# Patient Record
Sex: Female | Born: 1940
Health system: Southern US, Community
[De-identification: ages and names within clinical notes are randomized; demographics above are authoritative.]

## PROBLEM LIST (undated history)

## (undated) DIAGNOSIS — I10 Essential (primary) hypertension: Secondary | ICD-10-CM

## (undated) DIAGNOSIS — G8929 Other chronic pain: Secondary | ICD-10-CM

## (undated) DIAGNOSIS — M549 Dorsalgia, unspecified: Secondary | ICD-10-CM

## (undated) DIAGNOSIS — K209 Esophagitis, unspecified without bleeding: Secondary | ICD-10-CM

## (undated) DIAGNOSIS — H269 Unspecified cataract: Secondary | ICD-10-CM

## (undated) DIAGNOSIS — M79606 Pain in leg, unspecified: Secondary | ICD-10-CM

## (undated) DIAGNOSIS — K219 Gastro-esophageal reflux disease without esophagitis: Secondary | ICD-10-CM

## (undated) DIAGNOSIS — E785 Hyperlipidemia, unspecified: Secondary | ICD-10-CM

## (undated) DIAGNOSIS — M1990 Unspecified osteoarthritis, unspecified site: Secondary | ICD-10-CM

## (undated) DIAGNOSIS — T7840XA Allergy, unspecified, initial encounter: Secondary | ICD-10-CM

## (undated) DIAGNOSIS — K5790 Diverticulosis of intestine, part unspecified, without perforation or abscess without bleeding: Secondary | ICD-10-CM

## (undated) HISTORY — PX: CATARACT EXTRACTION: SUR2

## (undated) HISTORY — DX: Esophagitis, unspecified without bleeding: K20.90

## (undated) HISTORY — DX: Hyperlipidemia, unspecified: E78.5

## (undated) HISTORY — DX: Diverticulosis of intestine, part unspecified, without perforation or abscess without bleeding: K57.90

## (undated) HISTORY — DX: Unspecified cataract: H26.9

## (undated) HISTORY — DX: Essential (primary) hypertension: I10

## (undated) HISTORY — DX: Dorsalgia, unspecified: M54.9

## (undated) HISTORY — DX: Other chronic pain: G89.29

## (undated) HISTORY — DX: Gastro-esophageal reflux disease without esophagitis: K21.9

## (undated) HISTORY — PX: BREAST RECONSTRUCTION: SHX9

## (undated) HISTORY — PX: CHOLECYSTECTOMY: SHX55

## (undated) HISTORY — DX: Allergy, unspecified, initial encounter: T78.40XA

## (undated) HISTORY — DX: Esophagitis, unspecified: K20.9

## (undated) HISTORY — PX: TONSILLECTOMY: SUR1361

## (undated) HISTORY — DX: Unspecified osteoarthritis, unspecified site: M19.90

## (undated) HISTORY — DX: Pain in leg, unspecified: M79.606

## (undated) HISTORY — PX: ABDOMINAL HYSTERECTOMY: SHX81

## (undated) HISTORY — PX: DILATION AND CURETTAGE OF UTERUS: SHX78

---

## 2004-08-08 ENCOUNTER — Other Ambulatory Visit: Admission: RE | Admit: 2004-08-08 | Discharge: 2004-08-08 | Payer: Self-pay | Admitting: Obstetrics and Gynecology

## 2004-10-18 ENCOUNTER — Ambulatory Visit (HOSPITAL_BASED_OUTPATIENT_CLINIC_OR_DEPARTMENT_OTHER): Admission: RE | Admit: 2004-10-18 | Discharge: 2004-10-18 | Payer: Self-pay | Admitting: Plastic Surgery

## 2004-10-18 ENCOUNTER — Encounter (INDEPENDENT_AMBULATORY_CARE_PROVIDER_SITE_OTHER): Payer: Self-pay | Admitting: Specialist

## 2004-10-18 ENCOUNTER — Ambulatory Visit (HOSPITAL_COMMUNITY): Admission: RE | Admit: 2004-10-18 | Discharge: 2004-10-18 | Payer: Self-pay | Admitting: Plastic Surgery

## 2005-09-10 ENCOUNTER — Other Ambulatory Visit: Admission: RE | Admit: 2005-09-10 | Discharge: 2005-09-10 | Payer: Self-pay | Admitting: Obstetrics and Gynecology

## 2006-05-01 ENCOUNTER — Encounter: Admission: RE | Admit: 2006-05-01 | Discharge: 2006-05-01 | Payer: Self-pay | Admitting: Internal Medicine

## 2006-10-09 ENCOUNTER — Other Ambulatory Visit: Admission: RE | Admit: 2006-10-09 | Discharge: 2006-10-09 | Payer: Self-pay | Admitting: Obstetrics and Gynecology

## 2007-02-27 ENCOUNTER — Ambulatory Visit: Payer: Self-pay | Admitting: Internal Medicine

## 2007-03-17 ENCOUNTER — Ambulatory Visit: Payer: Self-pay | Admitting: Internal Medicine

## 2007-10-12 ENCOUNTER — Other Ambulatory Visit: Admission: RE | Admit: 2007-10-12 | Discharge: 2007-10-12 | Payer: Self-pay | Admitting: Obstetrics and Gynecology

## 2009-01-10 ENCOUNTER — Encounter: Admission: RE | Admit: 2009-01-10 | Discharge: 2009-01-10 | Payer: Self-pay | Admitting: Orthopaedic Surgery

## 2011-03-15 NOTE — Op Note (Signed)
NAMEKANG, ISHIDA              ACCOUNT NO.:  [id number]   MEDICAL RECORD NO.:  [id number]          PATIENT TYPE:  AMB   LOCATION:  DSC                          FACILITY:  [facility]   PHYSICIAN:  Alfredia Ferguson, M.D.  DATE OF BIRTH:  12-Dec-1940   DATE OF PROCEDURE:  10/18/2004  DATE OF DISCHARGE:                                 OPERATIVE REPORT   PREOPERATIVE DIAGNOSIS:  A 1.5-cm biopsy proven basal cell carcinoma of the  right temple.   POSTOPERATIVE DIAGNOSIS:  A 1.5-cm biopsy proven basal cell carcinoma of the  right temple.   PROCEDURE:  Excision of basal cell carcinoma with approximately 2- to 3-mm  margins.   SURGEON:  Alfredia Ferguson, M.D.   ANESTHESIA:  Two percent Xylocaine, 1:100,000 epinephrine.   INDICATIONS FOR SURGERY:  This is a 70 year old woman with a biopsy-proven,  large basal cell carcinoma in her right temple just behind the hair line.  Patient wishes to have this area excised.  The plan is to excise it with 2-  to 3-mm margins.  The risks of positive margins and recurrence of the lesion  were discussed with the patient.  She understands that and in spite of that  potential risk, wishes to proceed.   DESCRIPTION OF PROCEDURE:  An elliptical skin mark was placed around the  lesion and local anesthesia was infiltrated.  Hair was shaved in the area of  the excision.  After waiting 10 minutes, an elliptical excision of the  lesion down to the level of the temporalis muscle was carried out.  The  specimen was submitted for pathology.  Wound edges were undermined for a  distance of 1cm in all directions.  Hemostasis was accomplished using  electrocautery.  Wound was closed using multiple interrupted 4-0 Monocryl  sutures followed by running 4-0 nylon suture for the skin edges.  The area  was cleansed and dry and patient was discharged to home in satisfactory  condition.      Tiburcio Pea  D:  10/18/2004  T:  10/18/2004  Job:  [id number]   cc:   Hope M.  Danella Deis, M.D.  [address]  [place]  [place] [address]  Fax: [phone]

## 2011-05-24 ENCOUNTER — Encounter: Payer: Self-pay | Admitting: *Deleted

## 2011-05-24 ENCOUNTER — Telehealth: Payer: Self-pay | Admitting: Internal Medicine

## 2011-05-24 NOTE — Telephone Encounter (Signed)
Spoke with Malachi Bonds and scheduled patient on 06/03/11 at 2:30 PM with Dr. Juanda Chance. Malachi Bonds will fax records on patient. Letter mailed to patient.

## 2011-05-29 ENCOUNTER — Encounter: Payer: Self-pay | Admitting: Internal Medicine

## 2011-05-31 ENCOUNTER — Encounter: Payer: Self-pay | Admitting: Internal Medicine

## 2011-06-03 ENCOUNTER — Other Ambulatory Visit (INDEPENDENT_AMBULATORY_CARE_PROVIDER_SITE_OTHER): Payer: Medicare Other

## 2011-06-03 ENCOUNTER — Ambulatory Visit (INDEPENDENT_AMBULATORY_CARE_PROVIDER_SITE_OTHER): Payer: Medicare Other | Admitting: Internal Medicine

## 2011-06-03 ENCOUNTER — Encounter: Payer: Self-pay | Admitting: Internal Medicine

## 2011-06-03 VITALS — BP 114/72 | HR 68 | Ht 62.0 in | Wt 124.0 lb

## 2011-06-03 DIAGNOSIS — R109 Unspecified abdominal pain: Secondary | ICD-10-CM

## 2011-06-03 DIAGNOSIS — R1010 Upper abdominal pain, unspecified: Secondary | ICD-10-CM

## 2011-06-03 LAB — AMYLASE: Amylase: 103 U/L (ref 27–131)

## 2011-06-03 LAB — HEPATIC FUNCTION PANEL: ALT: 16 U/L (ref 0–35)

## 2011-06-03 NOTE — Patient Instructions (Signed)
You have been scheduled for an endoscopy. Please follow written instructions given to you at your visit today. You have been scheduled for an abdominal ultrasound at [facility] (1st floor of hospital) on 06/10/11 at 8:00 am. Please arrive 15 minutes prior to your appointment for registration. Make certain not to have anything to eat or drink 6 hours prior to your appointment. Should you need to reschedule your appointment, please contact radiology at [phone]. Your physician has requested that you go to the basement for the following lab work before leaving today: Hepatic Panel, Amylase, Lipase CC: Dr Buren Kos

## 2011-06-03 NOTE — Progress Notes (Signed)
Donna Lynn 1941-07-13 MRN [id number]     History of Present Illness:  This is a 70 year old, white female with an acute episode of upper abdominal pain radiating along her upper abdomen, which occurred several weeks ago and woke her up at 2 in the morning. The pain felt better  the next day but she was bloated and distended and was unable to eat for several days. She had a remote cholecystectomy in 1990s for cholelithiasis. The surgery was uncomplicated. We have done a screening colonoscopy in May 2008 which showed mild diverticulosis. She saw Dr.Shaw 10 days ago and was started on Prilosec 20 mg twice a day. The symptoms subsided but she had another attack 4 days ago that was even worse than the first one and occurred at night. She is now about 50% improved. There has been no fever, diarrhea or rectal bleeding.Her LFT's in [facility] office were normal   Past Medical History  Diagnosis Date  . Hypertension   . Hyperlipidemia   . Chronic back pain   . Chronic leg pain   . Diverticulosis   . Arthritis    Past Surgical History  Procedure Date  . Breast reconstruction     and cyst removal-right  . Dilation and curettage of uterus   . Tonsillectomy   . Abdominal hysterectomy     partial  . Cholecystectomy     reports that she has quit smoking. She has never used smokeless tobacco. She reports that she drinks alcohol. She reports that she does not use illicit drugs. family history includes Breast cancer in her mother; Diverticulosis in her mother; and Irritable bowel syndrome in her mother.  There is no history of Colon cancer. No Known Allergies      Review of Systems: Denies chest pain or shortness of breath denies diarrhea constipation or rectal bleeding  The remainder of the 10  point ROS is negative except as outlined in H&P   Physical Exam: General appearance  Well developed, in no distress. Eyes- non icteric. HEENT nontraumatic, normocephalic. Mouth no lesions,  tongue papillated, no cheilosis. Neck supple without adenopathy, thyroid not enlarged, no carotid bruits, no JVD. Lungs Clear to auscultation bilaterally. Cor normal S1 normal S2, regular rhythm , no murmur,  quiet precordium. Abdomen soft abdomen with normal active bowel sounds. No distention. Very tender in epigastrium but not very tender in the left or right upper quadrants. Liver edge is at the costal margin and the liver itself did not appear to be tender. Lower abdomen was unremarkable. Rectal: Not done. Extremities no pedal edema. Skin no lesions. Neurological alert and oriented x 3. Psychological normal mood and affect.  Assessment and Plan:  Problem #1 Acute upper abdominal pain now occurring in 2 discrete episodes. This could be due to Gastroesophageal reflux with spasm, hiatal hernia or gastritis. This could also be biliary colic due to choledocholithiasis with low-grade pancreatitis. We will repeat her liver function test, amylase and lipase which were all normal 10 days ago. We will schedule her for an upper endoscopy and keep her on Prilosec 20 mg twice a day. We will also go ahead and schedule an upper abdominal ultrasound to assess for common bile duct dilation and pancreas area and she will stay on a low-fat diet.   06/03/2011 [name]

## 2011-06-04 ENCOUNTER — Ambulatory Visit (AMBULATORY_SURGERY_CENTER): Payer: Medicare Other | Admitting: Internal Medicine

## 2011-06-04 ENCOUNTER — Encounter: Payer: Self-pay | Admitting: Internal Medicine

## 2011-06-04 VITALS — BP 154/87 | HR 80 | Temp 98.1°F | Resp 18 | Ht 62.0 in | Wt 124.0 lb

## 2011-06-04 DIAGNOSIS — R1010 Upper abdominal pain, unspecified: Secondary | ICD-10-CM

## 2011-06-04 DIAGNOSIS — K2100 Gastro-esophageal reflux disease with esophagitis, without bleeding: Secondary | ICD-10-CM

## 2011-06-04 DIAGNOSIS — R109 Unspecified abdominal pain: Secondary | ICD-10-CM

## 2011-06-04 DIAGNOSIS — K229 Disease of esophagus, unspecified: Secondary | ICD-10-CM

## 2011-06-04 MED ORDER — SODIUM CHLORIDE 0.9 % IV SOLN: 500.0000 mL | INTRAVENOUS | Status: DC

## 2011-06-04 NOTE — Patient Instructions (Signed)
Discharge instructions given to patient.  No handouts needed. Resume previous medications.

## 2011-06-05 ENCOUNTER — Telehealth: Payer: Self-pay | Admitting: Internal Medicine

## 2011-06-05 ENCOUNTER — Telehealth: Payer: Self-pay

## 2011-06-05 NOTE — Telephone Encounter (Signed)

## 2011-06-05 NOTE — Telephone Encounter (Signed)
Patient given radiology's number to call and reschedule with them.

## 2011-06-10 ENCOUNTER — Inpatient Hospital Stay (HOSPITAL_COMMUNITY): Admission: RE | Admit: 2011-06-10 | Payer: Medicare Other | Source: Ambulatory Visit

## 2011-06-10 ENCOUNTER — Encounter: Payer: Self-pay | Admitting: Internal Medicine

## 2012-01-01 ENCOUNTER — Other Ambulatory Visit: Payer: Self-pay | Admitting: Otolaryngology

## 2012-01-04 ENCOUNTER — Ambulatory Visit
Admission: RE | Admit: 2012-01-04 | Discharge: 2012-01-04 | Disposition: A | Discharge status: Home / Self Care | Payer: Medicare Other | Source: Ambulatory Visit | Attending: Otolaryngology | Admitting: Otolaryngology

## 2012-01-04 MED ORDER — GADOBENATE DIMEGLUMINE 529 MG/ML IV SOLN
10.0000 mL | Freq: Once | INTRAVENOUS | Status: AC | PRN
  Administered 2012-01-04: 10 mL via INTRAVENOUS

## 2013-12-30 LAB — IFOBT (OCCULT BLOOD): IMMUNOLOGICAL FECAL OCCULT BLOOD TEST: NEGATIVE

## 2014-04-01 ENCOUNTER — Encounter: Payer: Self-pay | Admitting: *Deleted

## 2014-04-19 ENCOUNTER — Ambulatory Visit (INDEPENDENT_AMBULATORY_CARE_PROVIDER_SITE_OTHER): Payer: Medicare Other | Admitting: Internal Medicine

## 2014-04-19 ENCOUNTER — Encounter: Payer: Self-pay | Admitting: Internal Medicine

## 2014-04-19 VITALS — BP 106/62 | HR 96 | Ht 62.0 in | Wt 124.0 lb

## 2014-04-19 DIAGNOSIS — R1319 Other dysphagia: Secondary | ICD-10-CM

## 2014-04-19 MED ORDER — PANTOPRAZOLE SODIUM 40 MG PO TBEC: 40.0000 mg | DELAYED_RELEASE_TABLET | Freq: Two times a day (BID) | ORAL | Status: DC

## 2014-04-19 NOTE — Patient Instructions (Signed)
You have been scheduled for an endoscopy. Please follow written instructions given to you at your visit today. If you use inhalers (even only as needed), please bring them with you on the day of your procedure. Your physician has requested that you go to www.startemmi.com and enter the access code given to you at your visit today. This web site gives a general overview about your procedure. However, you should still follow specific instructions given to you by our office regarding your preparation for the procedure.  We have sent the following medications to your pharmacy for you to pick up at your convenience: pantoprazole 40 mg twice daily  Please purchase the following medications over the counter and take as directed: Gaviscon-Chew 1-2 tablets as needed for gas/bloating  Please discontinue Advil.  CC:Dr Martha ClanWilliam Shaw

## 2014-04-19 NOTE — Progress Notes (Signed)
Donna BattyRebecca S Lynn [birth year] [id number]  Note: This dictation was prepared with Dragon digital system. Any transcriptional errors that result from this procedure are unintentional.   History of Present Illness:  This is a 73 year old white female with recurrent episodes of gastroesophageal reflux. We saw her in the past for the same problem in August 2012 when she underwent an upper endoscopy with findings of reflux esophagitis. She was started on Prilosec 20 mg twice a day but discontinued it over the past few months. About a month ago, the symptoms recurred and presented as severe chest pain and pain with swallowing. She was started on pantoprazole 40 mg twice a day by Dr. Buren Kosouglas Shaw which has improved dramatically her symptoms of reflux. Unfortunately, she still has dysphagia to solids. She had one episode of a food impaction which passed spontaneously. She was taking Advil 1 or 2 a day for several months earlier in the Spring for back pain. She no longer takes it. She has been under a great deal of stress with her 71 year old mother. She had a remote cholecystectomy. She had a colonoscopy in May 2008 which showed mild diverticulosis.    Past Medical History  Diagnosis Date  . Hypertension   . Hyperlipidemia   . Chronic back pain   . Chronic leg pain   . Diverticulosis   . Arthritis   . Esophagitis     Past Surgical History  Procedure Laterality Date  . Breast reconstruction      and cyst removal-right  . Dilation and curettage of uterus    . Tonsillectomy    . Abdominal hysterectomy      partial  . Cholecystectomy      No Known Allergies  Family history and social history have been reviewed.  Review of Systems:   The remainder of the 10 point ROS is negative except as outlined in the H&P  Physical Exam: General Appearance Well developed, in no distress Eyes  Non icteric  HEENT  Non traumatic, normocephalic  Mouth No lesion, tongue papillated, no cheilosis Neck  Supple without adenopathy, thyroid not enlarged, no carotid bruits, no JVD Lungs Clear to auscultation bilaterally COR Normal S1, normal S2, regular rhythm, no murmur, quiet precordium Abdomen soft nontender with normoactive bowel sounds. Mild subxiphoid tenderness Rectal soft Hemoccult negative stool Extremities  No pedal edema Skin No lesions Neurological Alert and oriented x 3 Psychological Normal mood and affect  Assessment and Plan:   Problem #721 73 year old white female with recurrent symptoms of gastroesophageal reflux previously evaluated in 2012. She is now having solid food dysphagia suggestive of esophageal stricture. She did take Prilosec shortly after the initial evaluation in 2012 but she is now back on pantoprazole which seems to be effective in symptom relief. We will proceed with an upper endoscopy and possible dilatation. We will also obtain biopsies to rule out Barrett's esophagus. She will remain on antireflux measures and pantoprazole 40 mg twice a day. I have also suggested using Gaviscon 1-2 tablets several times a day when necessary for immediate relief of dyspepsia.    Lina SarDora Lynn 04/19/2014

## 2014-04-26 ENCOUNTER — Encounter: Payer: Self-pay | Admitting: Internal Medicine

## 2014-05-06 ENCOUNTER — Ambulatory Visit: Payer: Medicare Other | Admitting: Internal Medicine

## 2014-05-10 ENCOUNTER — Ambulatory Visit: Payer: Medicare Other | Admitting: Internal Medicine

## 2014-05-18 ENCOUNTER — Encounter: Payer: Medicare Other | Admitting: Internal Medicine

## 2014-08-17 ENCOUNTER — Ambulatory Visit (AMBULATORY_SURGERY_CENTER): Payer: Self-pay | Admitting: *Deleted

## 2014-08-17 VITALS — Ht 62.0 in | Wt 123.0 lb

## 2014-08-17 DIAGNOSIS — R1314 Dysphagia, pharyngoesophageal phase: Secondary | ICD-10-CM

## 2014-08-17 NOTE — Progress Notes (Signed)
No egg or soy allergy. No anesthesia problems.  No home O2.  No diet meds.  

## 2014-08-23 ENCOUNTER — Encounter: Payer: Self-pay | Admitting: Internal Medicine

## 2014-08-31 ENCOUNTER — Encounter: Payer: Self-pay | Admitting: Internal Medicine

## 2014-08-31 ENCOUNTER — Ambulatory Visit (AMBULATORY_SURGERY_CENTER): Payer: Medicare Other | Admitting: Internal Medicine

## 2014-08-31 VITALS — BP 131/75 | HR 69 | Temp 97.9°F | Resp 15 | Ht 62.0 in | Wt 123.0 lb

## 2014-08-31 DIAGNOSIS — K219 Gastro-esophageal reflux disease without esophagitis: Secondary | ICD-10-CM

## 2014-08-31 DIAGNOSIS — R1310 Dysphagia, unspecified: Secondary | ICD-10-CM

## 2014-08-31 DIAGNOSIS — K208 Other esophagitis: Secondary | ICD-10-CM

## 2014-08-31 DIAGNOSIS — R1314 Dysphagia, pharyngoesophageal phase: Secondary | ICD-10-CM

## 2014-08-31 DIAGNOSIS — R1319 Other dysphagia: Secondary | ICD-10-CM

## 2014-08-31 MED ORDER — SODIUM CHLORIDE 0.9 % IV SOLN: 500.0000 mL | INTRAVENOUS | Status: DC

## 2014-08-31 NOTE — Progress Notes (Signed)
Patient awakening,vss,report to rn 

## 2014-08-31 NOTE — Patient Instructions (Signed)
YOU HAD AN ENDOSCOPIC PROCEDURE TODAY AT THE  ENDOSCOPY CENTER: Refer to the procedure report that was given to you for any specific questions about what was found during the examination.  If the procedure report does not answer your questions, please call your gastroenterologist to clarify.  If you requested that your care partner not be given the details of your procedure findings, then the procedure report has been included in a sealed envelope for you to review at your convenience later.  YOU SHOULD EXPECT: Some feelings of bloating in the abdomen. Passage of more gas than usual.  Walking can help get rid of the air that was put into your GI tract during the procedure and reduce the bloating. If you had a lower endoscopy (such as a colonoscopy or flexible sigmoidoscopy) you may notice spotting of blood in your stool or on the toilet paper. If you underwent a bowel prep for your procedure, then you may not have a normal bowel movement for a few days.  DIET: FOLLOW DILATION HANDOUT.  ACTIVITY: Your care partner should take you home directly after the procedure.  You should plan to take it easy, moving slowly for the rest of the day.  You can resume normal activity the day after the procedure however you should NOT DRIVE or use heavy machinery for 24 hours (because of the sedation medicines used during the test).    SYMPTOMS TO REPORT IMMEDIATELY: A gastroenterologist can be reached at any hour.  During normal business hours, 8:30 AM to 5:00 PM Monday through Friday, call [phone].  After hours and on weekends, please call the GI answering service at [phone] who will take a message and have the physician on call contact you.    Following upper endoscopy (EGD)  Vomiting of blood or coffee ground material  New chest pain or pain under the shoulder blades  Painful or persistently difficult swallowing  New shortness of breath  Fever of 100F or higher  Black, tarry-looking  stools  FOLLOW UP: If any biopsies were taken you will be contacted by phone or by letter within the next 1-3 weeks.  Call your gastroenterologist if you have not heard about the biopsies in 3 weeks.  Our staff will call the home number listed on your records the next business day following your procedure to check on you and address any questions or concerns that you may have at that time regarding the information given to you following your procedure. This is a courtesy call and so if there is no answer at the home number and we have not heard from you through the emergency physician on call, we will assume that you have returned to your regular daily activities without incident.  SIGNATURES/CONFIDENTIALITY: You and/or your care partner have signed paperwork which will be entered into your electronic medical record.  These signatures attest to the fact that that the information above on your After Visit Summary has been reviewed and is understood.  Full responsibility of the confidentiality of this discharge information lies with you and/or your care-partner.  Resume medications. Information given on Dilation Diet with discharge instructions.

## 2014-08-31 NOTE — Op Note (Signed)
[facility] [address] [place], [id number]   ENDOSCOPY PROCEDURE REPORT  PATIENT: Donna Lynn, Donna Lynn  MR#: [id number] BIRTHDATE: [birth year] , 73  yrs. old GENDER: female ENDOSCOPIST: Hart Carwinora M Dominigue Gellner, MD ASSISTANT: REFERRED BY:W.  Buren Kosouglas Shaw, M.D. PROCEDURE DATE:  08/31/2014 PROCEDURE:   EGD with biopsy and EGD with dilatation over guidewire  ASA CLASS:   Class II INDICATIONS:dysphagia. MEDICATIONS: Monitored anesthesia care and Propofol 150 mg IV TOPICAL ANESTHETIC:   none  DESCRIPTION OF PROCEDURE:   After the risks benefits and alternatives of the procedure were thoroughly explained, informed consent was obtained.  The LB [id number]  endoscope was introduced through the mouth  and advanced to the descending duodenum ,      The instrument was slowly withdrawn as the mucosa was carefully examined.  Esophagus: esophageal lumen was slightly dilated. There was small amount of retained secretions throughout the esophagus. There was no evidence evidence of stricture. The squamocolumnar junction was irregular. Multiple biopsies were obtained to rule out Barrett'Lynn esophagus Stomach: stomach was insufflated with air and and showed normal gastric mucosa with normal appearing rugal folds and gastric antrum. Pyloric outlet was normal. Retroflexion of the endoscope revealed normal fundus and cardia there was no significant hiatal hernia Duodenum: duodenal bulb and descending duodenum was normal  17 mm Savary dilator passed without difficulty, there was small amount of blood on the dilator      Dilation was then performed at the    COMPLICATIONS: There were no immediate complications.  ENDOSCOPIC IMPRESSION:  1. no evidence of esophageal stricture 2. 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 Status post passage of a 17 mm Savary dilator 4. Irregular squamocolumnar junction. Status post biopsies  RECOMMENDATIONS: 1.  Await  pathology results 2.  Anti-reflux regimen to be follow 3.  Continue PPI 4.  Consider barium esophagram if dysphagia continues  eSigned:  Hart Carwinora M Estanislao Harmon, MD 08/31/2014 4:19 PM  CC:  CPT CODES: ICD CODES:  The ICD and CPT codes recommended by this software are interpretations from the data that the clinical staff has captured with the software.  The verification of the translation of this report to the ICD and CPT codes and modifiers is the sole responsibility of the health care institution and practicing physician where this report was generated.  [employer] will not be held responsible for the validity of the ICD and CPT codes included on this report.  AMA assumes no liability for data contained or not contained herein. CPT is a Publishing rights managerregistered trademark of the [employer].  PATIENT NAME:  Donna Lynn, Donna Lynn MR#: [id number]

## 2014-08-31 NOTE — Progress Notes (Signed)
Called to room to assist during endoscopic procedure.  Patient ID and intended procedure confirmed with present staff. Received instructions for my participation in the procedure from the performing physician.  

## 2014-09-01 ENCOUNTER — Telehealth: Payer: Self-pay

## 2014-09-01 NOTE — Telephone Encounter (Signed)
  Follow up Call-  Call back number 08/31/2014  Post procedure Call Back phone  # [phone]  Permission to leave phone message Yes     Patient questions:  Do you have a fever, pain , or abdominal swelling? No. Pain Score  0 *  Have you tolerated food without any problems? Yes.    Have you been able to return to your normal activities? Yes.    Do you have any questions about your discharge instructions: Diet   No. Medications  No. Follow up visit  No.  Do you have questions or concerns about your Care? No.  Actions: * If pain score is 4 or above: No action needed, pain <4.

## 2014-09-07 ENCOUNTER — Encounter: Payer: Self-pay | Admitting: Internal Medicine

## 2014-09-13 ENCOUNTER — Telehealth: Payer: Self-pay | Admitting: Internal Medicine

## 2014-09-13 NOTE — Telephone Encounter (Signed)
Patient received her letter and wants to know what Barrett's is. Discussed with patient. She also wants information about reflux. Mailed out information.

## 2014-12-05 DIAGNOSIS — Z6823 Body mass index (BMI) 23.0-23.9, adult: Secondary | ICD-10-CM | POA: Diagnosis not present

## 2014-12-05 DIAGNOSIS — M546 Pain in thoracic spine: Secondary | ICD-10-CM | POA: Diagnosis not present

## 2014-12-05 DIAGNOSIS — R5383 Other fatigue: Secondary | ICD-10-CM | POA: Diagnosis not present

## 2014-12-05 DIAGNOSIS — J3489 Other specified disorders of nose and nasal sinuses: Secondary | ICD-10-CM | POA: Diagnosis not present

## 2014-12-26 DIAGNOSIS — M546 Pain in thoracic spine: Secondary | ICD-10-CM | POA: Diagnosis not present

## 2014-12-26 DIAGNOSIS — M5440 Lumbago with sciatica, unspecified side: Secondary | ICD-10-CM | POA: Diagnosis not present

## 2015-01-03 DIAGNOSIS — R7301 Impaired fasting glucose: Secondary | ICD-10-CM | POA: Diagnosis not present

## 2015-01-03 DIAGNOSIS — I10 Essential (primary) hypertension: Secondary | ICD-10-CM | POA: Diagnosis not present

## 2015-01-03 DIAGNOSIS — Z0000 Encounter for general adult medical examination without abnormal findings: Secondary | ICD-10-CM | POA: Diagnosis not present

## 2015-01-10 DIAGNOSIS — R5383 Other fatigue: Secondary | ICD-10-CM | POA: Diagnosis not present

## 2015-01-10 DIAGNOSIS — R7301 Impaired fasting glucose: Secondary | ICD-10-CM | POA: Diagnosis not present

## 2015-01-10 DIAGNOSIS — N958 Other specified menopausal and perimenopausal disorders: Secondary | ICD-10-CM | POA: Diagnosis not present

## 2015-01-10 DIAGNOSIS — Z0000 Encounter for general adult medical examination without abnormal findings: Secondary | ICD-10-CM | POA: Diagnosis not present

## 2015-01-10 DIAGNOSIS — M5416 Radiculopathy, lumbar region: Secondary | ICD-10-CM | POA: Diagnosis not present

## 2015-01-10 DIAGNOSIS — I10 Essential (primary) hypertension: Secondary | ICD-10-CM | POA: Diagnosis not present

## 2015-01-10 DIAGNOSIS — E785 Hyperlipidemia, unspecified: Secondary | ICD-10-CM | POA: Diagnosis not present

## 2015-01-10 DIAGNOSIS — K210 Gastro-esophageal reflux disease with esophagitis: Secondary | ICD-10-CM | POA: Diagnosis not present

## 2015-01-11 ENCOUNTER — Ambulatory Visit: Payer: Self-pay | Admitting: Podiatry

## 2015-01-12 DIAGNOSIS — Z1212 Encounter for screening for malignant neoplasm of rectum: Secondary | ICD-10-CM | POA: Diagnosis not present

## 2015-01-18 DIAGNOSIS — M546 Pain in thoracic spine: Secondary | ICD-10-CM | POA: Diagnosis not present

## 2015-01-18 DIAGNOSIS — M5440 Lumbago with sciatica, unspecified side: Secondary | ICD-10-CM | POA: Diagnosis not present

## 2015-01-20 ENCOUNTER — Other Ambulatory Visit: Payer: Self-pay | Admitting: Orthopaedic Surgery

## 2015-01-20 DIAGNOSIS — M545 Low back pain: Secondary | ICD-10-CM

## 2015-02-01 ENCOUNTER — Telehealth: Payer: Self-pay | Admitting: *Deleted

## 2015-02-01 MED ORDER — PANTOPRAZOLE SODIUM 40 MG PO TBEC: 40.0000 mg | DELAYED_RELEASE_TABLET | Freq: Two times a day (BID) | ORAL | Status: DC

## 2015-02-01 NOTE — Telephone Encounter (Signed)
Sent Rx for pantoprazole (PROTONIX), 40 mg, #60 with 2 refills to [facility] on 02/01/15.

## 2015-02-11 ENCOUNTER — Ambulatory Visit
Admission: RE | Admit: 2015-02-11 | Discharge: 2015-02-11 | Disposition: A | Discharge status: Home / Self Care | Payer: Self-pay | Source: Ambulatory Visit | Attending: Orthopaedic Surgery | Admitting: Orthopaedic Surgery

## 2015-02-11 DIAGNOSIS — M545 Low back pain: Secondary | ICD-10-CM

## 2015-02-11 DIAGNOSIS — M5137 Other intervertebral disc degeneration, lumbosacral region: Secondary | ICD-10-CM | POA: Diagnosis not present

## 2015-02-11 DIAGNOSIS — M4806 Spinal stenosis, lumbar region: Secondary | ICD-10-CM | POA: Diagnosis not present

## 2015-02-11 DIAGNOSIS — M47817 Spondylosis without myelopathy or radiculopathy, lumbosacral region: Secondary | ICD-10-CM | POA: Diagnosis not present

## 2015-02-11 DIAGNOSIS — M4317 Spondylolisthesis, lumbosacral region: Secondary | ICD-10-CM | POA: Diagnosis not present

## 2015-02-15 DIAGNOSIS — M5440 Lumbago with sciatica, unspecified side: Secondary | ICD-10-CM | POA: Diagnosis not present

## 2015-03-06 DIAGNOSIS — M5416 Radiculopathy, lumbar region: Secondary | ICD-10-CM | POA: Diagnosis not present

## 2015-04-06 DIAGNOSIS — M5416 Radiculopathy, lumbar region: Secondary | ICD-10-CM | POA: Diagnosis not present

## 2015-04-06 DIAGNOSIS — M4806 Spinal stenosis, lumbar region: Secondary | ICD-10-CM | POA: Diagnosis not present

## 2015-04-06 DIAGNOSIS — M5115 Intervertebral disc disorders with radiculopathy, thoracolumbar region: Secondary | ICD-10-CM | POA: Diagnosis not present

## 2015-06-12 DIAGNOSIS — S99922A Unspecified injury of left foot, initial encounter: Secondary | ICD-10-CM | POA: Diagnosis not present

## 2015-07-07 ENCOUNTER — Ambulatory Visit (INDEPENDENT_AMBULATORY_CARE_PROVIDER_SITE_OTHER): Payer: Commercial Managed Care - HMO | Admitting: Podiatry

## 2015-07-07 ENCOUNTER — Encounter: Payer: Self-pay | Admitting: Podiatry

## 2015-07-07 VITALS — BP 116/67 | HR 86 | Ht 62.0 in | Wt 124.0 lb

## 2015-07-07 DIAGNOSIS — M7742 Metatarsalgia, left foot: Secondary | ICD-10-CM | POA: Insufficient documentation

## 2015-07-07 DIAGNOSIS — M21969 Unspecified acquired deformity of unspecified lower leg: Secondary | ICD-10-CM | POA: Diagnosis not present

## 2015-07-07 DIAGNOSIS — S99922A Unspecified injury of left foot, initial encounter: Secondary | ICD-10-CM

## 2015-07-07 NOTE — Progress Notes (Signed)
Subjective: 74 year old female presents stating that she Injured left foot 4 weeks ago and been taking care of it her self. Jammed her foot into a post. Pain is under the base of 3, 4, 5th toes. Because she was walking carefully and her leg muscles are sore.  Pt. Brought x-ay that show no acute changes in osseous structure.  Objective: Dermatologic: Normal findings. No open skin lesion or abnormal findings. Vascular: All pedal pulses are palpable. No edema or erythema noted. Minimum change at the joints that are hurting, 3rd, 4th, and 5th MPJ left foot. Neurologic: All epicritic and tactile sensations grossly intact. Orthopedic: Noted of large bunion deformities bilateral. Excess sagittal plane motion of the first ray upon loading of forefoot bilateral. Lateral weight shifting upon weight bearing bilateral.   Assessment:  1. Status post left foot injury 4 weeks ago, left foot jammed into a wall post. No osseous injury in X-ray findings. 2. Increased lesser metatarsalgia due to Hypermobile first ray that had been aggravated further by recent injury.  3. Hypermobile first ray with bunion deformity bilateral.  Plan:  Reviewed clinical findings and available treatment options. May benefit from Metatarsal binder and wear lace up tennis shoes to provide more stability on the first MPJ. If pain continues, will need to have custom orthotics.  Return as needed.

## 2015-07-07 NOTE — Patient Instructions (Signed)
Seen for injured left foot. Noted of weak and excess motion of the fore foot and injured site.  X-ray show no abnormal findings. May benefit from Metatarsal binder and wear lace up tennis shoes. Return as needed.

## 2015-09-11 DIAGNOSIS — N952 Postmenopausal atrophic vaginitis: Secondary | ICD-10-CM | POA: Diagnosis not present

## 2015-09-11 DIAGNOSIS — R3129 Other microscopic hematuria: Secondary | ICD-10-CM | POA: Diagnosis not present

## 2015-09-11 DIAGNOSIS — R8290 Unspecified abnormal findings in urine: Secondary | ICD-10-CM | POA: Diagnosis not present

## 2015-09-11 DIAGNOSIS — J019 Acute sinusitis, unspecified: Secondary | ICD-10-CM | POA: Diagnosis not present

## 2015-09-11 DIAGNOSIS — Z6823 Body mass index (BMI) 23.0-23.9, adult: Secondary | ICD-10-CM | POA: Diagnosis not present

## 2015-10-05 DIAGNOSIS — M546 Pain in thoracic spine: Secondary | ICD-10-CM | POA: Diagnosis not present

## 2015-10-05 DIAGNOSIS — M4806 Spinal stenosis, lumbar region: Secondary | ICD-10-CM | POA: Diagnosis not present

## 2015-10-05 DIAGNOSIS — M5440 Lumbago with sciatica, unspecified side: Secondary | ICD-10-CM | POA: Diagnosis not present

## 2015-10-05 DIAGNOSIS — M5115 Intervertebral disc disorders with radiculopathy, thoracolumbar region: Secondary | ICD-10-CM | POA: Diagnosis not present

## 2015-10-26 DIAGNOSIS — Z803 Family history of malignant neoplasm of breast: Secondary | ICD-10-CM | POA: Diagnosis not present

## 2015-10-26 DIAGNOSIS — Z1231 Encounter for screening mammogram for malignant neoplasm of breast: Secondary | ICD-10-CM | POA: Diagnosis not present

## 2015-11-13 DIAGNOSIS — R05 Cough: Secondary | ICD-10-CM | POA: Diagnosis not present

## 2015-11-13 DIAGNOSIS — Z6824 Body mass index (BMI) 24.0-24.9, adult: Secondary | ICD-10-CM | POA: Diagnosis not present

## 2015-11-13 DIAGNOSIS — J0100 Acute maxillary sinusitis, unspecified: Secondary | ICD-10-CM | POA: Diagnosis not present

## 2016-01-08 DIAGNOSIS — R7301 Impaired fasting glucose: Secondary | ICD-10-CM | POA: Diagnosis not present

## 2016-01-08 DIAGNOSIS — E784 Other hyperlipidemia: Secondary | ICD-10-CM | POA: Diagnosis not present

## 2016-01-08 DIAGNOSIS — Z0000 Encounter for general adult medical examination without abnormal findings: Secondary | ICD-10-CM | POA: Diagnosis not present

## 2016-01-08 DIAGNOSIS — I10 Essential (primary) hypertension: Secondary | ICD-10-CM | POA: Diagnosis not present

## 2016-01-15 DIAGNOSIS — E784 Other hyperlipidemia: Secondary | ICD-10-CM | POA: Diagnosis not present

## 2016-01-15 DIAGNOSIS — R5383 Other fatigue: Secondary | ICD-10-CM | POA: Diagnosis not present

## 2016-01-15 DIAGNOSIS — I10 Essential (primary) hypertension: Secondary | ICD-10-CM | POA: Diagnosis not present

## 2016-01-15 DIAGNOSIS — Z1389 Encounter for screening for other disorder: Secondary | ICD-10-CM | POA: Diagnosis not present

## 2016-01-15 DIAGNOSIS — Z0000 Encounter for general adult medical examination without abnormal findings: Secondary | ICD-10-CM | POA: Diagnosis not present

## 2016-01-15 DIAGNOSIS — F329 Major depressive disorder, single episode, unspecified: Secondary | ICD-10-CM | POA: Diagnosis not present

## 2016-01-15 DIAGNOSIS — N958 Other specified menopausal and perimenopausal disorders: Secondary | ICD-10-CM | POA: Diagnosis not present

## 2016-01-15 DIAGNOSIS — R7301 Impaired fasting glucose: Secondary | ICD-10-CM | POA: Diagnosis not present

## 2016-01-15 DIAGNOSIS — K210 Gastro-esophageal reflux disease with esophagitis: Secondary | ICD-10-CM | POA: Diagnosis not present

## 2016-01-16 DIAGNOSIS — Z1212 Encounter for screening for malignant neoplasm of rectum: Secondary | ICD-10-CM | POA: Diagnosis not present

## 2016-03-20 DIAGNOSIS — M546 Pain in thoracic spine: Secondary | ICD-10-CM | POA: Diagnosis not present

## 2016-03-20 DIAGNOSIS — Z23 Encounter for immunization: Secondary | ICD-10-CM | POA: Diagnosis not present

## 2016-04-17 DIAGNOSIS — N951 Menopausal and female climacteric states: Secondary | ICD-10-CM | POA: Diagnosis not present

## 2016-04-17 DIAGNOSIS — R1031 Right lower quadrant pain: Secondary | ICD-10-CM | POA: Diagnosis not present

## 2016-04-17 DIAGNOSIS — N952 Postmenopausal atrophic vaginitis: Secondary | ICD-10-CM | POA: Diagnosis not present

## 2016-05-15 DIAGNOSIS — R1031 Right lower quadrant pain: Secondary | ICD-10-CM | POA: Diagnosis not present

## 2016-08-06 DIAGNOSIS — Z6823 Body mass index (BMI) 23.0-23.9, adult: Secondary | ICD-10-CM | POA: Diagnosis not present

## 2016-08-06 DIAGNOSIS — J0100 Acute maxillary sinusitis, unspecified: Secondary | ICD-10-CM | POA: Diagnosis not present

## 2016-09-02 DIAGNOSIS — H5201 Hypermetropia, right eye: Secondary | ICD-10-CM | POA: Diagnosis not present

## 2016-09-02 DIAGNOSIS — H04123 Dry eye syndrome of bilateral lacrimal glands: Secondary | ICD-10-CM | POA: Diagnosis not present

## 2016-09-02 DIAGNOSIS — H524 Presbyopia: Secondary | ICD-10-CM | POA: Diagnosis not present

## 2016-09-02 DIAGNOSIS — H52223 Regular astigmatism, bilateral: Secondary | ICD-10-CM | POA: Diagnosis not present

## 2016-09-02 DIAGNOSIS — H1859 Other hereditary corneal dystrophies: Secondary | ICD-10-CM | POA: Diagnosis not present

## 2016-09-02 DIAGNOSIS — H40053 Ocular hypertension, bilateral: Secondary | ICD-10-CM | POA: Diagnosis not present

## 2016-09-02 DIAGNOSIS — H16223 Keratoconjunctivitis sicca, not specified as Sjogren's, bilateral: Secondary | ICD-10-CM | POA: Diagnosis not present

## 2016-09-09 DIAGNOSIS — J309 Allergic rhinitis, unspecified: Secondary | ICD-10-CM | POA: Diagnosis not present

## 2016-09-09 DIAGNOSIS — I10 Essential (primary) hypertension: Secondary | ICD-10-CM | POA: Diagnosis not present

## 2016-09-09 DIAGNOSIS — H8113 Benign paroxysmal vertigo, bilateral: Secondary | ICD-10-CM | POA: Diagnosis not present

## 2016-09-09 DIAGNOSIS — Z6823 Body mass index (BMI) 23.0-23.9, adult: Secondary | ICD-10-CM | POA: Diagnosis not present

## 2016-10-22 ENCOUNTER — Encounter: Payer: Self-pay | Admitting: Gastroenterology

## 2016-10-22 ENCOUNTER — Other Ambulatory Visit: Payer: Self-pay | Admitting: Gastroenterology

## 2016-10-22 MED ORDER — PANTOPRAZOLE SODIUM 40 MG PO TBEC
40.0000 mg | DELAYED_RELEASE_TABLET | Freq: Two times a day (BID) | ORAL | 0 refills | Status: DC
Start: 2016-10-22 — End: 2017-01-15

## 2016-10-22 NOTE — Telephone Encounter (Signed)
Prescription sent as requested and pt advised to keep appt

## 2016-10-29 DIAGNOSIS — Z8262 Family history of osteoporosis: Secondary | ICD-10-CM | POA: Diagnosis not present

## 2016-10-29 DIAGNOSIS — Z780 Asymptomatic menopausal state: Secondary | ICD-10-CM | POA: Diagnosis not present

## 2016-10-29 DIAGNOSIS — Z803 Family history of malignant neoplasm of breast: Secondary | ICD-10-CM | POA: Diagnosis not present

## 2016-10-29 DIAGNOSIS — Z1231 Encounter for screening mammogram for malignant neoplasm of breast: Secondary | ICD-10-CM | POA: Diagnosis not present

## 2016-11-04 ENCOUNTER — Encounter: Payer: Self-pay | Admitting: Allergy

## 2016-11-04 ENCOUNTER — Ambulatory Visit (INDEPENDENT_AMBULATORY_CARE_PROVIDER_SITE_OTHER): Payer: Medicare HMO | Admitting: Allergy

## 2016-11-04 VITALS — BP 122/68 | HR 93 | Temp 97.8°F | Resp 16 | Ht 60.45 in | Wt 124.8 lb

## 2016-11-04 DIAGNOSIS — H1010 Acute atopic conjunctivitis, unspecified eye: Secondary | ICD-10-CM | POA: Diagnosis not present

## 2016-11-04 DIAGNOSIS — J309 Allergic rhinitis, unspecified: Secondary | ICD-10-CM | POA: Diagnosis not present

## 2016-11-04 MED ORDER — OLOPATADINE HCL 0.7 % OP SOLN: 1.0000 [drp] | OPHTHALMIC | 5 refills | Status: DC

## 2016-11-04 MED ORDER — AZELASTINE HCL 0.1 % NA SOLN: 2.0000 | Freq: Two times a day (BID) | NASAL | 5 refills | Status: DC

## 2016-11-04 NOTE — Patient Instructions (Addendum)
Nasal, eye and ear symptoms may be due to allergies.   Unable to perform skin testing today due to poor response of histamine.    Will obtain environmental allergen panel via blood work.   We will call you with results.    At this time would recommend taking a long-acting antihistamine like Allegra 180mg , Zyrtec 10mg  or Xyzal 5mg  daily   For nasal symptoms will try Astelin 2 sprays each nostril twice a day to help with nasal drainage.    For nasal congestion use Nasocort 2 sprays each nostril daily -- use for at least 1-2 weeks at a time before stopping.   Continue nasal saline rinses.   Trial on Pazeo eye drop 1 drop each eye as needed for itchy, red or watery eyes.    Follow-up 2-3 months

## 2016-11-04 NOTE — Progress Notes (Signed)
New Patient Note  RE: Donna Lynn MRN: [id number] DOB: May 18, 1941 Date of Office Visit: 11/04/2016  Referring provider: Martha ClanShaw, William, MD Primary care provider: Martha ClanShaw, William, MD  Chief Complaint: allergy testing, cough  History of present illness: Donna Lynn is a 76 y.o. female presenting today for consultation for allergies and cough.  She reports she was having issues in the fall 2017.   She reports issues with her equilibrium as well as itchy, dry eye; nasal congestion and drainage; ear fullness and sneezing.  She has tried eyedrops for dry eye including Restasis as well as artificial tear drop.  She also had a dry cough which she states her PCP attributed to her old BP medication which was stopped.  She reports she still has the dry cough.  She clears her throat a lot throughout the day.  She started off taking an allergy relief medication that she can take every 4-6 hours but she did not feel it was helpful so she changed to generic benadryl.  She has used nasal saline rinses too.  She has been using Flonase but not sure if it was helpful.   She would like allergy testing today however she feels she may have taken her benadryl on Friday night.  She reports she has been on different medications to help with her symptoms and has required antibiotics once since fall.  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 She has noted that symptoms are especially worse when the homeowners have dogs.  She reports no history of asthma or eczema.  She does report many years ago seeing an allergist who told her she had a cinnamon allergy. She was also treated by her previous allergist with immunotherapy. When [place] was living in [place]   She does take pantoprazole for reflux.   Review of systems: Review of Systems  Constitutional: Negative for chills, fever and malaise/fatigue.  HENT: Positive for congestion  and tinnitus. Negative for ear discharge, ear pain, hearing loss, nosebleeds, sinus pain and sore throat.   Eyes: Negative for discharge and redness.  Respiratory: Positive for cough. Negative for shortness of breath and wheezing.   Cardiovascular: Negative for chest pain.  Gastrointestinal: Negative for heartburn, nausea and vomiting.  Skin: Negative for itching and rash.    All other systems negative unless noted above in HPI  Past medical history: Past Medical History:  Diagnosis Date  . Allergy   . Arthritis   . Cataract    left eye surgery  . Chronic back pain   . Chronic leg pain   . Diverticulosis   . Esophagitis   . GERD (gastroesophageal reflux disease)   . Hyperlipidemia   . Hypertension     Past surgical history: Past Surgical History:  Procedure Laterality Date  . ABDOMINAL HYSTERECTOMY     partial  . BREAST RECONSTRUCTION     and cyst removal-right  . CATARACT EXTRACTION    . CHOLECYSTECTOMY    . DILATION AND CURETTAGE OF UTERUS    . TONSILLECTOMY      Family history:  Family History  Problem Relation Age of Onset  . Diverticulosis Mother   . Breast cancer Mother   . Irritable bowel syndrome Mother   . Colon cancer Neg Hx   . Esophageal cancer Neg Hx   . Stomach cancer Neg Hx   . Allergic rhinitis  Neg Hx   . Angioedema Neg Hx   . Asthma Neg Hx   . Eczema Neg Hx   . Immunodeficiency Neg Hx   . Urticaria Neg Hx     Social history:  Lives in a 74 year old home with carpeting and gas heating and central cooling. There is a cat in the home. There is concern for water damage or mildew in the home. There are no roaches in the home. Social History  . Marital status: Divorced   Leisure centre manager    . bank teller [employer]   Social History Main Topics  . Smoking status: Former Games developer  . Smokeless tobacco: Never Used    Medication List: Allergies as of 11/04/2016   No Known Allergies     Medication List         Accurate as of 11/04/16 11:55 AM. Always use your most recent med list.          irbesartan 150 MG tablet Commonly known as:  AVAPRO Take 150 mg by mouth daily.   pantoprazole 40 MG tablet Commonly known as:  PROTONIX Take 1 tablet (40 mg total) by mouth 2 (two) times daily.   RESTASIS 0.05 % ophthalmic emulsion Generic drug:  cycloSPORINE Place 0.4 drops into both eyes 2 (two) times daily.       Known medication allergies: No Known Allergies   Physical examination: Blood pressure 122/68, pulse 93, temperature 97.8 F (36.6 C), temperature source Oral, resp. rate 16, height 5' 0.45" (1.535 m), weight 124 lb 12.8 oz (56.6 kg), SpO2 94 %.  General: Alert, interactive, in no acute distress. HEENT: TMs pearly gray, turbinates moderately edematous with clear discharge, post-pharynx mildly erythematous with mild cobblestoning. Neck: Supple without lymphadenopathy. Lungs: Clear to auscultation without wheezing, rhonchi or rales. {no increased work of breathing. CV: Normal S1, S2 without murmurs. Abdomen: Nondistended, nontender. Skin: Warm and dry, without lesions or rashes. Extremities:  No clubbing, cyanosis or edema. Neuro:   Grossly intact.  Diagnositics/Labs:  Allergy testing: Unable to perform due to recent antihistamine use. Histamine control did not react. Allergy testing results were read and interpreted by provider, documented by clinical staff.   Assessment and plan:   Allergic rhinoconjunctivitis   - She has a previous history of allergen immunotherapy   - Her nasal ocular and ear symptoms are likely due to allergen she is exposed to especially during her job.  Her dry cough is most likely related to postnasal drainage as she has significant amount of throat clearing.  If below regimen does not improve cough will consider obtaining spirometry   - We'll obtain serum IgE levels for environmental allergens as unable to perform skin prick testing today.   If she is  to consider immunotherapy again would like to have skin prick testing which can be done at a future visit.   - recommend taking a long-acting antihistamine like Allegra 180mg , Zyrtec 10mg  or Xyzal 5mg  daily    - For nasal symptoms will try Astelin 2 sprays each nostril twice a day to help with nasal drainage.  For nasal congestion use Nasocort 2 sprays each nostril daily -- use for at least 1-2 weeks at a time before stopping.    - Continue nasal saline rinses prior to nasal spray use.    - Trial on Pazeo eye drop 1 drop each eye as needed for itchy, red or watery eyes.    Follow-up 2-3 months  I appreciate the  opportunity to take part in Zahirah's care. Please do not hesitate to contact me with questions.  Sincerely,   Margo Aye, MD Allergy/Immunology [facility]

## 2016-11-05 LAB — CP584 ZONE 3
Allergen, Cedar tree, t12: <0.1 kU/L
Allergen, Oak,t7: <0.1 kU/L
Allergen, P. notatum, m1: <0.1 kU/L
Allergen, S. Botryosum, m10: <0.1 kU/L
Aspergillus fumigatus, m3: <0.1 kU/L
Bahia Grass: <0.1 kU/L
Bermuda Grass: <0.1 kU/L
Box Elder IgE: <0.1 kU/L
Cat Dander: <0.1 kU/L
Dog Dander: <0.1 kU/L
Nettle: <0.1 kU/L
Rough Pigweed  IgE: <0.1 kU/L

## 2016-11-12 ENCOUNTER — Encounter: Payer: Self-pay | Admitting: Gastroenterology

## 2016-11-12 ENCOUNTER — Ambulatory Visit (INDEPENDENT_AMBULATORY_CARE_PROVIDER_SITE_OTHER): Payer: Medicare HMO | Admitting: Gastroenterology

## 2016-11-12 VITALS — BP 100/60 | HR 88 | Ht 61.25 in | Wt 124.4 lb

## 2016-11-12 DIAGNOSIS — K219 Gastro-esophageal reflux disease without esophagitis: Secondary | ICD-10-CM | POA: Diagnosis not present

## 2016-11-12 DIAGNOSIS — R1310 Dysphagia, unspecified: Secondary | ICD-10-CM

## 2016-11-12 DIAGNOSIS — R1319 Other dysphagia: Secondary | ICD-10-CM

## 2016-11-12 NOTE — Progress Notes (Signed)
[facility] Progress Note  Chief Complaint: Dysphagia and chest pain  Subjective  History:  This is a 76 year old woman previously seen by Dr. Juanda ChanceBrodie. Her last visit was in June 2015, when she was having chronic chest pain felt likely to be GERD and also dysphagia. Upper endoscopy later that year showed a mildly dilated lumen, no reported stricture, and a 17 mm wire-guided dilation was performed. The patient believes she had improvement in all her symptoms, though it is not clear if it was from the endoscopic dilation with a chronic PPI therapy. She stopped taking it after several months and felt well between then and about 2 months ago. At that time, she started developing intermittent nonexertional chest burning and pain after meals with intermittent feelings of food stuck in the lower chest. She denies nausea vomiting or weight loss. Her bowel habits of an regular without rectal bleeding.  ROS: No dysuria Respiratory: no dyspnea  The patient's Past Medical, Family and Social History were reviewed and are on file in the EMR.  Objective:  Med list reviewed  Vital signs in last 24 hrs: Vitals:   11/12/16 1000  BP: 100/60  Pulse: 88    Physical Exam    HEENT: sclera anicteric, oral mucosa moist without lesions  Neck: supple, no thyromegaly, JVD or lymphadenopathy  Cardiac: RRR without murmurs, S1S2 heard, no peripheral edema  Pulm: clear to auscultation bilaterally, normal RR and effort noted  Abdomen: soft, No tenderness, with active bowel sounds. No guarding or palpable hepatosplenomegaly.  Skin; warm and dry, no jaundice or rash   Radiologic studies: No barium studies have been done    @ASSESSMENTPLANBEGIN @ Assessment: Encounter Diagnoses  Name Primary?  . Esophageal dysphagia Yes  . Gastroesophageal reflux disease without esophagitis    Dr. Regino SchultzeBrodie's impression was that of a dysmotility such as presbyesophagus. Curiously, however, she notes the patient  previously had had a food impaction. This does not seem to coincide with no reported stricture on endoscopy. It's possible there was a mild ring not evident on endoscopy, but one would not expect that to cause dysphagia, much less food impaction. I wonder if she could have had evolving achalasia. If so, it is strange that she has not had interval symptoms until about 2 months ago. Overall, it seems most likely to be reflux related dysmotility.   Plan:  Re-dose her PPI to before supper meal instead of bedtime Continue it for about 2 months, and then take it every other day for about a week and then stop. Barium swallow to make sure she does not have findings suggestive of achalasia.  Total time 25 minutes, over half spent in counseling and coordination of care.   Charlie PitterHenry L Danis III

## 2016-11-12 NOTE — Patient Instructions (Signed)
If you are age 74 or older, your body mass index should be between 23-30. Your Body mass index is 23.31 kg/m. If this is out of the aforementioned range listed, please consider follow up with your Primary Care Provider.  If you are age 74 or younger, your body mass index should be between 19-25. Your Body mass index is 23.31 kg/m. If this is out of the aformentioned range listed, please consider follow up with your Primary Care Provider.     You have been scheduled for a Barium [facility]ophogram [facility] (1st floor of the hospital) on 11-25-2016 at 930am. Please arrive 15 minutes prior to your appointment for registration. Make certain not to have anything to eat or drink 3 hours prior to your test. If you need to reschedule for any reason, please contact radiology at [phone] to do so. __________________________________________________________________ A barium swallow is an examination that concentrates on views of the esophagus. This tends to be a double contrast exam (barium and two liquids which, when combined, create a gas to distend the wall of the oesophagus) or single contrast (non-ionic iodine based). The study is usually tailored to your symptoms so a good history is essential. Attention is paid during the study to the form, structure and configuration of the esophagus, looking for functional disorders (such as aspiration, dysphagia, achalasia, motility and reflux) EXAMINATION You may be asked to change into a gown, depending on the type of swallow being performed. A radiologist and radiographer will perform the procedure. The radiologist will advise you of the type of contrast selected for your procedure and direct you during the exam. You will be asked to stand, sit or lie in several different positions and to hold a small amount of fluid in your mouth before being asked to swallow while the imaging is performed .In some instances you may be asked to swallow barium coated  marshmallows to assess the motility of a solid food bolus. The exam can be recorded as a digital or video fluoroscopy procedure. POST PROCEDURE It will take 1-2 days for the barium to pass through your system. To facilitate this, it is important, unless otherwise directed, to increase your fluids for the next 24-48hrs and to resume your normal diet.  This test typically takes about 30 minutes to perform. __________________________________________________________________________________  Thank you for choosing [facility]  Dr Amada JupiterHenry Danis III

## 2016-11-25 ENCOUNTER — Ambulatory Visit (HOSPITAL_COMMUNITY)
Admission: RE | Admit: 2016-11-25 | Discharge: 2016-11-25 | Disposition: A | Discharge status: Home / Self Care | Payer: Medicare HMO | Source: Ambulatory Visit | Attending: Gastroenterology | Admitting: Gastroenterology

## 2016-11-25 DIAGNOSIS — R1319 Other dysphagia: Secondary | ICD-10-CM

## 2016-11-25 DIAGNOSIS — K219 Gastro-esophageal reflux disease without esophagitis: Secondary | ICD-10-CM | POA: Diagnosis not present

## 2016-11-25 DIAGNOSIS — R1310 Dysphagia, unspecified: Secondary | ICD-10-CM | POA: Diagnosis not present

## 2016-11-25 DIAGNOSIS — R1013 Epigastric pain: Secondary | ICD-10-CM | POA: Diagnosis not present

## 2016-11-26 DIAGNOSIS — H25811 Combined forms of age-related cataract, right eye: Secondary | ICD-10-CM | POA: Diagnosis not present

## 2016-11-27 ENCOUNTER — Telehealth: Payer: Self-pay | Admitting: Gastroenterology

## 2016-12-02 ENCOUNTER — Telehealth: Payer: Self-pay | Admitting: Gastroenterology

## 2016-12-02 ENCOUNTER — Ambulatory Visit (INDEPENDENT_AMBULATORY_CARE_PROVIDER_SITE_OTHER): Payer: Medicare HMO | Admitting: Physician Assistant

## 2016-12-02 ENCOUNTER — Ambulatory Visit: Payer: Medicare HMO | Admitting: Allergy

## 2016-12-02 ENCOUNTER — Encounter: Payer: Self-pay | Admitting: Physician Assistant

## 2016-12-02 VITALS — BP 124/70 | HR 104 | Ht 61.25 in | Wt 126.2 lb

## 2016-12-02 DIAGNOSIS — K648 Other hemorrhoids: Secondary | ICD-10-CM

## 2016-12-02 DIAGNOSIS — Z800 Family history of malignant neoplasm of digestive organs: Secondary | ICD-10-CM | POA: Diagnosis not present

## 2016-12-02 DIAGNOSIS — K5730 Diverticulosis of large intestine without perforation or abscess without bleeding: Secondary | ICD-10-CM | POA: Diagnosis not present

## 2016-12-02 DIAGNOSIS — K6289 Other specified diseases of anus and rectum: Secondary | ICD-10-CM | POA: Diagnosis not present

## 2016-12-02 DIAGNOSIS — K219 Gastro-esophageal reflux disease without esophagitis: Secondary | ICD-10-CM

## 2016-12-02 MED ORDER — NA SULFATE-K SULFATE-MG SULF 17.5-3.13-1.6 GM/177ML PO SOLN: ORAL | 0 refills | Status: DC

## 2016-12-02 MED ORDER — HYDROCORTISONE ACETATE 25 MG RE SUPP
25.0000 mg | Freq: Every day | RECTAL | 1 refills | Status: DC
Start: 2016-12-02 — End: 2017-01-15

## 2016-12-02 NOTE — Progress Notes (Addendum)
Subjective:    Patient ID: Donna Lynn, female    DOB: Mar 23, 1941, 76 y.o.   MRN: [id number]  HPI Donna Lynn. She was seen in the office a few weeks ago for follow-up of GERD and dysphagia. She has since had a barium swallow done on 11/25/2016 which did not show any evidence of stricture. Report states motility occasionally sluggish 13 mm tablet passed without difficulty. She is continuing on PPI therapy. She comes in today with complaints of rectal pain which she has had over the past 1-1/2 weeks. He had had similar symptoms in the past but this has lasted for a long time. She describes her pain as an aching discomfort which is been waking her up at night. She says sometimes this has a pulling-type discomfort that makes her feel as if she has stool in her rectum. She has not noticed any bleeding. His been using some Preparation H which may be helping a bit. She stays on a stool softener and probiotic for chronic difficulty with mild constipation and hard stools. She does not recall any significant straining or passage of hard stool and says she is not having pain with defecation. Her brother was diagnosed with colon cancer in his early 82s. Last colonoscopy May 2008 per Dr. [name] with mild diverticulosis, no polyps and no hemorrhoids noted.  Review of Systems Pertinent positive and negative review of systems were noted in the above HPI section.  All other review of systems was otherwise negative.  Outpatient Encounter Prescriptions as of 12/02/2016  Medication Sig  . docusate sodium (COLACE) 100 MG capsule Take 100 mg by mouth daily.  Marland Kitchen estradiol (ESTRACE) 0.5 MG tablet Take 0.5 mg by mouth daily.  . irbesartan (AVAPRO) 150 MG tablet Take 75 mg by mouth daily.   Marland Kitchen loratadine (CLARITIN) 10 MG tablet Take 10 mg by mouth daily.  . Olopatadine HCl (PAZEO) 0.7 % SOLN Place 1 drop into both eyes 1 day or 1 dose. As needed for itchy, red  or watery eyes.  . pantoprazole (PROTONIX) 40 MG tablet Take 1 tablet (40 mg total) by mouth 2 (two) times daily.  . Probiotic Product (PROBIOTIC PO) Take 1 tablet by mouth daily.  . RESTASIS 0.05 % ophthalmic emulsion Place 0.4 drops into both eyes 2 (two) times daily.  . Triamcinolone Acetonide (NASACORT ALLERGY 24HR NA) Place 1 spray into the nose daily.  . hydrocortisone (ANUSOL-HC) 25 MG suppository Place 1 suppository (25 mg total) rectally at bedtime.  . Na Sulfate-K Sulfate-Mg Sulf 17.5-3.13-1.6 GM/180ML SOLN Suprep-Use as directed   No facility-administered encounter medications on file as of 12/02/2016.    No Known Allergies Patient Active Problem List   Diagnosis Date Noted  . Metatarsalgia of left foot 07/07/2015  . Metatarsal deformity 07/07/2015  . Injury of left foot 07/07/2015   Social History   Social History  . Marital status: Divorced    Spouse name: N/A  . Number of children: 0  . Years of education: N/A   Occupational History  . retired Licensed conveyancer [place]   Social History Main Topics  . Smoking status: Former Smoker    Years: 3.00  . Smokeless tobacco: Never Used  . Alcohol use No  . Drug use: No  . Sexual activity: Not on file   Other Topics Concern  . Not on file   Social History Narrative  . No narrative on file  Donna Lynn's family history includes Arthritis in her mother; Breast cancer in her mother; Dementia in her mother; Diverticulosis in her mother; Irritable bowel syndrome in her mother; Lung cancer in her brother; Thyroid cancer in her brother; Thyroid disease in her mother.      Objective:    Vitals:   12/02/16 1458  BP: 124/70  Pulse: (!) 104    Physical Exam Well-developed older white female in no acute distress, pleasant blood pressure 124/70 pulse 126, BMI 23.6. HEENT ;nontraumatic normocephalic EOMI PERRLA sclera anicteric, Cardiovascular; regular rate and rhythm with S1-S2 no murmur rub or gallop, Pulmonary ;clear  bilaterally, Abdomen; soft, nontender nondistended bowel sounds are active there is no palpable mass or hepatosplenomegaly, Rectal ;exam small external hemorrhoidal tag, she is nontender to digital exam no palpable mass on anoscopy she does have one internal hemorrhoid. Extremities; no clubbing cyanosis or edema skin warm and dry, Neuropsych; mood and affect appropriate      Assessment & Plan:   #421 76 year old with 2 week history of rectal pain, frequently waking her at night. On exam she does have an internal hemorrhoid which may be symptomatic, she has not had any bleeding. Rule out occult colon or rectal lesion Last colonoscopy 2008 #2 family history of colon cancer in patient's brother #3 GERD  Plan; start Anusol HC suppositories daily at bedtime 10-14 days Encouraged her to try MiraLAX 17 g in 8 ounces of water daily which may help prevent straining and hard stools Will schedule for colonoscopy with Dr. Myrtie Neitheranis.. Procedure discussed in detail with patient including risks and benefits and she is agreeable to proceed.  Donna Bora Donna HillockS Donna Mccurdy PA-C 12/02/2016  Thank you for sending this case to me. I have reviewed the entire note, and the outlined plan seems appropriate.  Agree with colonoscopy due to symptoms and how long it has been since last scope.  However, assuming no findings on that, this seems like proctalgia fugax.  Donna JupiterHenry Danis, MD  Cc: Donna ClanShaw, William, MD

## 2016-12-02 NOTE — Patient Instructions (Signed)
You have been scheduled for a colonoscopy. Please follow written instructions given to you at your visit today.  Please pick up your prep supplies at the pharmacy within the next 1-3 days. If you use inhalers (even only as needed), please bring them with you on the day of your procedure. Your physician has requested that you go to www.startemmi.com and enter the access code given to you at your visit today. This web site gives a general overview about your procedure. However, you should still follow specific instructions given to you by our office regarding your preparation for the procedure.  We have sent the following medications to your pharmacy for you to pick up at your convenience: Anusol per rectum every night  If you are age 74 or older, your body mass index should be between 23-30. Your Body mass index is 23.65 kg/m. If this is out of the aforementioned range listed, please consider follow up with your Primary Care Provider.  If you are age 74 or younger, your body mass index should be between 19-25. Your Body mass index is 23.65 kg/m. If this is out of the aformentioned range listed, please consider follow up with your Primary Care Provider.

## 2016-12-03 ENCOUNTER — Other Ambulatory Visit: Payer: Self-pay

## 2016-12-03 NOTE — Telephone Encounter (Signed)
Left a message for pt to return call. Pt has been added to the wait list for RX sample of Suprep.

## 2016-12-04 NOTE — Telephone Encounter (Signed)
Pt notified and aware. ?

## 2016-12-26 NOTE — Telephone Encounter (Signed)
Left a message to inform pt her sample of suprep is ready for pick up

## 2016-12-30 ENCOUNTER — Encounter: Payer: Self-pay | Admitting: Allergy

## 2016-12-30 ENCOUNTER — Ambulatory Visit (INDEPENDENT_AMBULATORY_CARE_PROVIDER_SITE_OTHER): Payer: Medicare HMO | Admitting: Allergy

## 2016-12-30 DIAGNOSIS — H1010 Acute atopic conjunctivitis, unspecified eye: Secondary | ICD-10-CM | POA: Diagnosis not present

## 2016-12-30 DIAGNOSIS — J309 Allergic rhinitis, unspecified: Secondary | ICD-10-CM | POA: Diagnosis not present

## 2016-12-30 MED ORDER — AZELASTINE HCL 0.15 % NA SOLN: 2.0000 | Freq: Two times a day (BID) | NASAL | 5 refills | Status: DC

## 2016-12-30 MED ORDER — CETIRIZINE HCL 10 MG PO TABS: 10.0000 mg | ORAL_TABLET | Freq: Every day | ORAL | 5 refills | Status: DC

## 2016-12-30 MED ORDER — OLOPATADINE HCL 0.7 % OP SOLN: 1.0000 [drp] | Freq: Every day | OPHTHALMIC | 5 refills | Status: DC | PRN

## 2016-12-30 NOTE — Progress Notes (Signed)
Follow-up Note  RE: Donna GlazierRebecca S Lynn MRN: [id number] DOB: Jan 14, 1941 Date of Office Visit: 12/30/2016   History of present illness: Donna BattyRebecca S Lynn is a 76 y.o. female presenting today for follow-up of allergic rhinoconjunctivitis. She was unable to have allergy testing her last visit due to antihistamine use.  She was last seen in the office in 11/04/2016 by myself.  She has held all of her medications since last Tuesday and she does report more sneezing and itchy nose and eyes.    She reports the zyrtec when she takes it helps as well as the Astelin nasal spray.    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       Review of systems: Review of Systems  Constitutional: Negative for chills, fever and malaise/fatigue.  HENT: Positive for congestion. Negative for ear discharge, ear pain, nosebleeds, sinus pain and sore throat.   Eyes: Negative for discharge and redness.  Respiratory: Negative for cough, shortness of breath and wheezing.   Cardiovascular: Negative for chest pain.  Gastrointestinal: Negative for abdominal pain, diarrhea, nausea and vomiting.  Musculoskeletal: Negative for joint pain and myalgias.  Skin: Negative for itching and rash.  Neurological: Negative for headaches.    All other systems negative unless noted above in HPI  Past medical/social/surgical/family history have been reviewed and are unchanged unless specifically indicated below.  No changes  Medication List: Allergies as of 12/30/2016   No Known Allergies     Medication List       Accurate as of 12/30/16  4:39 PM. Always use your most recent med list.          Azelastine HCl 0.15 % Soln Place 2 sprays into both nostrils 2 (two) times daily.   cetirizine 10 MG tablet Commonly known as:  ZYRTEC ALLERGY Take 1 tablet (10 mg total) by mouth daily.     docusate sodium 100 MG capsule Commonly known as:  COLACE Take 100 mg by mouth daily.   estradiol 0.5 MG tablet Commonly known as:  ESTRACE Take 0.5 mg by mouth daily.   hydrocortisone 25 MG suppository Commonly known as:  ANUSOL-HC Place 1 suppository (25 mg total) rectally at bedtime.   irbesartan 150 MG tablet Commonly known as:  AVAPRO Take 75 mg by mouth daily.   loratadine 10 MG tablet Commonly known as:  CLARITIN Take 10 mg by mouth daily.   NASACORT ALLERGY 24HR NA Place 1 spray into the nose daily.   Olopatadine HCl 0.7 % Soln Commonly known as:  PAZEO Place 1 drop into both eyes daily as needed. As needed for itchy, red or watery eyes.   pantoprazole 40 MG tablet Commonly known as:  PROTONIX Take 1 tablet (40 mg total) by mouth 2 (two) times daily.   PROBIOTIC PO Take 1 tablet by mouth daily.   RESTASIS 0.05 % ophthalmic emulsion Generic drug:  cycloSPORINE Place 0.4 drops into both eyes 2 (two) times daily.       Known medication allergies: No Known Allergies   Physical examination: Blood pressure 110/60, pulse 92, temperature 98.4 F (36.9 C), temperature source Oral, SpO2 95 %.  General: Alert, interactive, in no acute distress. HEENT: TMs pearly gray, turbinates moderately edematous without discharge, post-pharynx non erythematous. Neck: Supple without lymphadenopathy. Lungs: Clear to auscultation without wheezing, rhonchi or  rales. {no increased work of breathing. CV: Normal S1, S2 without murmurs. Abdomen: Nondistended, nontender. Skin: Warm and dry, without lesions or rashes. Extremities:  No clubbing, cyanosis or edema. Neuro:   Grossly intact.  Diagnositics/Labs: Labs:  Component     Latest Ref Rng & Units 11/04/2016  Allergen, D pternoyssinus,d7     kU/L <0.10  D. farinae     kU/L <0.10  Cat Dander     kU/L <0.10  Dog Dander     kU/L <0.10  [place] Grass     kU/L <0.10  Meadow Grass     kU/L <0.10  [place] Grass     kU/L  <0.10  [place]ia Grass     kU/L <0.10  Cockroach     kU/L <0.10  Allergen, P. notatum, m1     kU/L <0.10  Allergen, C. Herbarum, M2     kU/L <0.10  Aspergillus fumigatus, m3     kU/L <0.10  Allergen, Mucor Racemosus, M4     kU/L <0.10  Allergen, A. alternata, m6     kU/L <0.10  Allergen, S. Botryosum, m10     kU/L <0.10  Box Elder IgE     kU/L <0.10  Allergen, Comm Silver Charletta Cousin, t9     kU/L <0.10  Allergen, Cedar tree, t12     kU/L <0.10  Allergen, Oak,t7     kU/L <0.10  Elm IgE     kU/L <0.10  Pecan/Hickory Tree IgE     kU/L <0.10  Allergen, Mulberry, t76     kU/L <0.10  Common Ragweed     kU/L <0.10  Plantain     kU/L <0.10  Rough Pigweed  IgE     kU/L <0.10  Nettle     kU/L <0.10  Allergen, Black Locust, Acacia9     kU/L <0.10    Allergy testing: skin prick testing was mildly positive to d. Pter.   Intradermal testing was positive to mold mix 2, tree mix and dog Allergy testing results were read and interpreted by provider, documented by clinical staff.   Assessment and plan:   Allergic Rhinoconjunctivitis   - Allergy skin testing was posotive for mold (aspergillus and penicillium), trees, dust mite and dog.     - At this time would continue taking a long-acting antihistamine like Allegra 180mg , Zyrtec 10mg  or Xyzal 5mg  daily    - For nasal symptoms continue Astelin 2 sprays each nostril twice a day to help with nasal drainage.    For nasal congestion use Nasocort 2 sprays each nostril daily -- use for at least 1-2 weeks at a time before stopping.    - Continue nasal saline rinses.    - Use Pazeo eye drop 1 drop each eye as needed for itchy, red or watery eyes.    Follow-up  6-9 months  I appreciate the opportunity to take part in Lysbeth's care. Please do not hesitate to contact me with questions.  Sincerely,   Margo Aye, MD Allergy/Immunology [facility]

## 2016-12-30 NOTE — Patient Instructions (Addendum)
Allergy skin testing was posotive for mold (aspergillus and penicillium), trees, dust mite and dog.    At this time would continue taking a long-acting antihistamine like Allegra 180mg , Zyrtec 10mg  or Xyzal 5mg  daily   For nasal symptoms continue Astelin 2 sprays each nostril twice a day to help with nasal drainage.    For nasal congestion use Nasocort 2 sprays each nostril daily -- use for at least 1-2 weeks at a time before stopping.   Continue nasal saline rinses.   Use Pazeo eye drop 1 drop each eye as needed for itchy, red or watery eyes.    Follow-up  6-9 months

## 2016-12-31 ENCOUNTER — Encounter: Payer: Self-pay | Admitting: Gastroenterology

## 2017-01-13 ENCOUNTER — Ambulatory Visit (AMBULATORY_SURGERY_CENTER): Payer: Medicare HMO | Admitting: Gastroenterology

## 2017-01-13 ENCOUNTER — Encounter: Payer: Self-pay | Admitting: Gastroenterology

## 2017-01-13 ENCOUNTER — Encounter: Payer: Medicare HMO | Admitting: Gastroenterology

## 2017-01-13 VITALS — BP 126/68 | HR 79 | Temp 98.6°F | Resp 11 | Ht 60.0 in | Wt 126.0 lb

## 2017-01-13 DIAGNOSIS — K219 Gastro-esophageal reflux disease without esophagitis: Secondary | ICD-10-CM | POA: Diagnosis not present

## 2017-01-13 DIAGNOSIS — K6289 Other specified diseases of anus and rectum: Secondary | ICD-10-CM

## 2017-01-13 DIAGNOSIS — Z86010 Personal history of colonic polyps: Secondary | ICD-10-CM | POA: Diagnosis not present

## 2017-01-13 DIAGNOSIS — K5730 Diverticulosis of large intestine without perforation or abscess without bleeding: Secondary | ICD-10-CM

## 2017-01-13 DIAGNOSIS — Z800 Family history of malignant neoplasm of digestive organs: Secondary | ICD-10-CM | POA: Diagnosis not present

## 2017-01-13 MED ORDER — SODIUM CHLORIDE 0.9 % IV SOLN: 500.0000 mL | INTRAVENOUS | Status: AC

## 2017-01-13 NOTE — Progress Notes (Signed)
Report given to PACU, vss 

## 2017-01-13 NOTE — Op Note (Signed)
[facility] Patient Name: Donna Lynn Procedure Date: 01/13/2017 2:41 PM MRN: [id number] Endoscopist: Sherilyn Cooter L. Myrtie Neither , MD Age: 76 Referring MD:  Date of Birth: 1941-06-16 Gender: Female Account #: [id number] Procedure:                Colonoscopy Indications:              Rectal pain Medicines:                Monitored Anesthesia Care Procedure:                Pre-Anesthesia Assessment:                           - Prior to the procedure, a History and Physical                            was performed, and patient medications and                            allergies were reviewed. The patient's tolerance of                            previous anesthesia was also reviewed. The risks                            and benefits of the procedure and the sedation                            options and risks were discussed with the patient.                            All questions were answered, and informed consent                            was obtained. Prior Anticoagulants: The patient has                            taken no previous anticoagulant or antiplatelet                            agents. ASA Grade Assessment: II - A patient with                            mild systemic disease. After reviewing the risks                            and benefits, the patient was deemed in                            satisfactory condition to undergo the procedure.                           After obtaining informed consent, the colonoscope  was passed under direct vision. Throughout the                            procedure, the patient's blood pressure, pulse, and                            oxygen saturations were monitored continuously. The                            Colonoscope was introduced through the anus and                            advanced to the the cecum, identified by                            appendiceal orifice and ileocecal valve. The                             colonoscopy was performed without difficulty. The                            patient tolerated the procedure well. The quality                            of the bowel preparation was good. The ileocecal                            valve, appendiceal orifice, and rectum were                            photographed. The quality of the bowel preparation                            was evaluated using the BBPS [facility]([place] Bowel                            Preparation Scale) with scores of: Right Colon = 2,                            Transverse Colon = 2 and Left Colon = 2. The total                            BBPS score equals 6. The bowel preparation used was                            SUPREP. Scope In: 2:57:26 PM Scope Out: 3:07:01 PM Scope Withdrawal Time: 0 hours 7 minutes 2 seconds  Total Procedure Duration: 0 hours 9 minutes 35 seconds  Findings:                 The perianal and digital rectal examinations were                            normal.  Multiple medium-mouthed diverticula were found in                            the left colon.                           Internal hemorrhoids were found during                            retroflexion. The hemorrhoids were medium-sized and                            Grade I (internal hemorrhoids that do not prolapse).                           The exam was otherwise without abnormality on                            direct and retroflexion views. Complications:            No immediate complications. Estimated Blood Loss:     Estimated blood loss: none. Impression:               - Diverticulosis in the left colon.                           - Internal hemorrhoids.                           - The examination was otherwise normal on direct                            and retroflexion views.                           - No specimens collected.                           The patient's reported symptoms are most consistent                             with proctalgia fugax. Recommendation:           - Patient has a contact number available for                            emergencies. The signs and symptoms of potential                            delayed complications were discussed with the                            patient. Return to normal activities tomorrow.                            Written discharge instructions were provided to the  patient.                           - Resume previous diet.                           - Continue present medications.                           - No future coutine colonoscopy due to age.                           Patient reports symptom improvement with as-needed                            use of anusol-HC suppositories. Daenerys Buttram L. Myrtie Neither, MD 01/13/2017 3:17:55 PM This report has been signed electronically.

## 2017-01-13 NOTE — Patient Instructions (Signed)
YOU HAD AN ENDOSCOPIC PROCEDURE TODAY AT THE [facility]:   Refer to the procedure report that was given to you for any specific questions about what was found during the examination.  If the procedure report does not answer your questions, please call your gastroenterologist to clarify.  If you requested that your care partner not be given the details of your procedure findings, then the procedure report has been included in a sealed envelope for you to review at your convenience later.  YOU SHOULD EXPECT: Some feelings of bloating in the abdomen. Passage of more gas than usual.  Walking can help get rid of the air that was put into your GI tract during the procedure and reduce the bloating. If you had a lower endoscopy (such as a colonoscopy or flexible sigmoidoscopy) you may notice spotting of blood in your stool or on the toilet paper. If you underwent a bowel prep for your procedure, you may not have a normal bowel movement for a few days.  Please Note:  You might notice some irritation and congestion in your nose or some drainage.  This is from the oxygen used during your procedure.  There is no need for concern and it should clear up in a day or so.  SYMPTOMS TO REPORT IMMEDIATELY:   Following lower endoscopy (colonoscopy or flexible sigmoidoscopy):  Excessive amounts of blood in the stool  Significant tenderness or worsening of abdominal pains  Swelling of the abdomen that is new, acute  Fever of 100F or higher    For urgent or emergent issues, a gastroenterologist can be reached at any hour by calling [phone].   DIET:  We do recommend a small meal at first, but then you may proceed to your regular diet.  Drink plenty of fluids but you should avoid alcoholic beverages for 24 hours.  ACTIVITY:  You should plan to take it easy for the rest of today and you should NOT DRIVE or use heavy machinery until tomorrow (because of the sedation medicines used during the test).     FOLLOW UP: Our staff will call the number listed on your records the next business day following your procedure to check on you and address any questions or concerns that you may have regarding the information given to you following your procedure. If we do not reach you, we will leave a message.  However, if you are feeling well and you are not experiencing any problems, there is no need to return our call.  We will assume that you have returned to your regular daily activities without incident.  If any biopsies were taken you will be contacted by phone or by letter within the next 1-3 weeks.  Please call us at [phone] if you have not heard about the biopsies in 3 weeks.    SIGNATURES/CONFIDENTIALITY: You and/or your care partner have signed paperwork which will be entered into your electronic medical record.  These signatures attest to the fact that that the information above on your After Visit Summary has been reviewed and is understood.  Full responsibility of the confidentiality of this discharge information lies with you and/or your care-partner.   Resume medications. Information given on hemorrhoids,diverticulosis and high fiber.

## 2017-01-13 NOTE — Progress Notes (Signed)
Pt's states no medical or surgical changes since previsit or office visit. 

## 2017-01-14 ENCOUNTER — Telehealth: Payer: Self-pay | Admitting: Gastroenterology

## 2017-01-14 ENCOUNTER — Telehealth: Payer: Self-pay | Admitting: *Deleted

## 2017-01-14 NOTE — Telephone Encounter (Signed)
  Follow up Call-  Call back number 01/13/2017 08/31/2014  Post procedure Call Back phone  # [phone]  Permission to leave phone message Yes Yes  Some recent data might be hidden     Patient questions:  Do you have a fever, pain , or abdominal swelling? No. Pain Score  0 *  Have you tolerated food without any problems? Yes.    Have you been able to return to your normal activities? Yes.    Do you have any questions about your discharge instructions: Diet   No. Medications  No. Follow up visit  No.  Do you have questions or concerns about your Care? Yes patient did report that she was out of her " hemorrhoid suppository " and she had called Dr. Myrtie Neitheranis for a refill.   Actions: * If pain score is 4 or above: No action needed, pain <4.

## 2017-01-14 NOTE — Telephone Encounter (Signed)
Did you want to order Entocort for this pt?

## 2017-01-14 NOTE — Telephone Encounter (Signed)
Not Entocort.  Please refill the HC suppository, which I told her she can use twice a week as needed for this rectal pain when it wakes her at night.  Disp #20, RF 2  Decrease pantoprazole to once every other day for a week.  If heartburn stable, decrease it to twice a week for 2 weeks.  At that point, if GERD symptoms stable stop the medicine.

## 2017-01-15 ENCOUNTER — Other Ambulatory Visit: Payer: Self-pay

## 2017-01-15 MED ORDER — PANTOPRAZOLE SODIUM 40 MG PO TBEC: 40.0000 mg | DELAYED_RELEASE_TABLET | Freq: Two times a day (BID) | ORAL | 0 refills | Status: DC

## 2017-01-15 MED ORDER — HYDROCORTISONE ACETATE 25 MG RE SUPP: RECTAL | 1 refills | Status: DC

## 2017-01-15 NOTE — Telephone Encounter (Signed)
Pt notified and aware. Rx sent as requested.

## 2017-01-15 NOTE — Telephone Encounter (Signed)
Left message to return call 

## 2017-02-03 DIAGNOSIS — I10 Essential (primary) hypertension: Secondary | ICD-10-CM | POA: Diagnosis not present

## 2017-02-03 DIAGNOSIS — E784 Other hyperlipidemia: Secondary | ICD-10-CM | POA: Diagnosis not present

## 2017-02-03 DIAGNOSIS — R7301 Impaired fasting glucose: Secondary | ICD-10-CM | POA: Diagnosis not present

## 2017-02-11 ENCOUNTER — Other Ambulatory Visit: Payer: Self-pay | Admitting: Gastroenterology

## 2017-02-17 DIAGNOSIS — K594 Anal spasm: Secondary | ICD-10-CM | POA: Diagnosis not present

## 2017-02-17 DIAGNOSIS — E784 Other hyperlipidemia: Secondary | ICD-10-CM | POA: Diagnosis not present

## 2017-02-17 DIAGNOSIS — K219 Gastro-esophageal reflux disease without esophagitis: Secondary | ICD-10-CM | POA: Diagnosis not present

## 2017-02-17 DIAGNOSIS — Z0000 Encounter for general adult medical examination without abnormal findings: Secondary | ICD-10-CM | POA: Diagnosis not present

## 2017-02-17 DIAGNOSIS — N952 Postmenopausal atrophic vaginitis: Secondary | ICD-10-CM | POA: Diagnosis not present

## 2017-02-17 DIAGNOSIS — Z6823 Body mass index (BMI) 23.0-23.9, adult: Secondary | ICD-10-CM | POA: Diagnosis not present

## 2017-02-17 DIAGNOSIS — R7301 Impaired fasting glucose: Secondary | ICD-10-CM | POA: Diagnosis not present

## 2017-02-17 DIAGNOSIS — I10 Essential (primary) hypertension: Secondary | ICD-10-CM | POA: Diagnosis not present

## 2017-02-17 DIAGNOSIS — N958 Other specified menopausal and perimenopausal disorders: Secondary | ICD-10-CM | POA: Diagnosis not present

## 2017-02-17 DIAGNOSIS — R69 Illness, unspecified: Secondary | ICD-10-CM | POA: Diagnosis not present

## 2017-03-04 DIAGNOSIS — Z1212 Encounter for screening for malignant neoplasm of rectum: Secondary | ICD-10-CM | POA: Diagnosis not present

## 2017-03-12 DIAGNOSIS — K219 Gastro-esophageal reflux disease without esophagitis: Secondary | ICD-10-CM | POA: Diagnosis not present

## 2017-03-12 DIAGNOSIS — R112 Nausea with vomiting, unspecified: Secondary | ICD-10-CM | POA: Diagnosis not present

## 2017-03-12 DIAGNOSIS — Z6824 Body mass index (BMI) 24.0-24.9, adult: Secondary | ICD-10-CM | POA: Diagnosis not present

## 2017-04-22 DIAGNOSIS — S61551A Open bite of right wrist, initial encounter: Secondary | ICD-10-CM | POA: Diagnosis not present

## 2017-04-22 DIAGNOSIS — T63444A Toxic effect of venom of bees, undetermined, initial encounter: Secondary | ICD-10-CM | POA: Diagnosis not present

## 2017-04-22 DIAGNOSIS — Z6823 Body mass index (BMI) 23.0-23.9, adult: Secondary | ICD-10-CM | POA: Diagnosis not present

## 2017-05-20 DIAGNOSIS — R69 Illness, unspecified: Secondary | ICD-10-CM | POA: Diagnosis not present

## 2017-05-28 ENCOUNTER — Ambulatory Visit (INDEPENDENT_AMBULATORY_CARE_PROVIDER_SITE_OTHER): Payer: Medicare HMO

## 2017-05-28 ENCOUNTER — Ambulatory Visit (INDEPENDENT_AMBULATORY_CARE_PROVIDER_SITE_OTHER): Payer: Medicare HMO | Admitting: Physician Assistant

## 2017-05-28 ENCOUNTER — Telehealth (INDEPENDENT_AMBULATORY_CARE_PROVIDER_SITE_OTHER): Payer: Self-pay | Admitting: Orthopaedic Surgery

## 2017-05-28 DIAGNOSIS — M25562 Pain in left knee: Secondary | ICD-10-CM | POA: Diagnosis not present

## 2017-05-28 DIAGNOSIS — M5442 Lumbago with sciatica, left side: Secondary | ICD-10-CM

## 2017-05-28 DIAGNOSIS — G8929 Other chronic pain: Secondary | ICD-10-CM

## 2017-05-28 MED ORDER — METHYLPREDNISOLONE 4 MG PO TABS: ORAL_TABLET | ORAL | 0 refills | Status: DC

## 2017-05-28 MED ORDER — TIZANIDINE HCL 2 MG PO CAPS: 2.0000 mg | ORAL_CAPSULE | Freq: Three times a day (TID) | ORAL | 1 refills | Status: DC

## 2017-05-28 NOTE — Telephone Encounter (Signed)
FYI-PT CALLED AND STATED SHE HAD AN ADVERSE REACTION TO THE MUSCLE RELAXER PRESCRIBED AND THAT SHE CANNOT TAKE

## 2017-05-28 NOTE — Progress Notes (Signed)
Office Visit Note   Patient: Donna Lynn           Date of Birth: 12/19/1940           MRN: [id number] Visit Date: 05/28/2017              Requested by: Donna ClanShaw, William, MD [address] [place], [place] [address] PCP: Donna ClanShaw, William, MD   Assessment & Plan: Visit Diagnoses:  1. Chronic midline low back pain with left-sided sciatica   2. Acute pain of left knee     Plan: Handout for back exercises was given and reviewed with the patient. Also have her work on quad strengthening exercises as shown today see her back in 2 weeks' check progress lack of she continues to have pain in the back may consider epidural steroid injections. In regards to the knee knee pain continues to consider steroid injection. Questions encouraged and answered.  Follow-Up Instructions: Return in about 2 weeks (around 06/11/2017).   Orders:  Orders Placed This Encounter  Procedures  . XR Knee 1-2 Views Left  . XR Lumbar Spine 2-3 Views   Meds ordered this encounter  Medications  . methylPREDNISolone (MEDROL) 4 MG tablet    Sig: Take as directed    Dispense:  21 tablet    Refill:  0  . tizanidine (ZANAFLEX) 2 MG capsule    Sig: Take 1 capsule (2 mg total) by mouth 3 (three) times daily.    Dispense:  40 capsule    Refill:  1      Procedures: No procedures performed   Clinical Data: No additional findings.   Subjective: Low back pain Left knee pain  HPI Donna Lynn is well well known to Donna Lynn's service comes in today with low back pain. She states she's had low back pain on and off since 2016. Again she is noting degenerative disc disease with high-grade 1 spondylolisthesis L4-5 on S1 and a mild grade 1 L4 on L5 spinal stenosis. She is had epidural steroid injections in the past that helped with the elbow very last one did not help as much. She's had no known injury to her back. She states she's had back pain that radiates from the low back into the upper thighs left slightly  greater than right. She's having no awaking pain no bowel or bladder dysfunction. She does note that she lifted a door couple weeks ago and feels that she strained her back. She has been doing some stretching but has not been doing her home exercise program and she is taught by therapy in the past. She is also having left knee pain. No mechanical symptoms she's had no swelling no injury to the knee. She's tried heat the Voltaren gel over-the-counter icing, hot-type medication without any real relief. She states that Aleve helps the most she also tried some ice last night and feels that the knee overall is better today. She describes the pain as a toothache like pain. It is worse whenever going up and down steps. 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   Review of Systems Denies shortness breath chest pain dizziness loss consciousness fevers chills. Denies mechanical symptoms of the left knee  Objective: Vital Signs: There were no vitals taken for this visit.  Physical Exam  Constitutional: She is oriented to person, place, and time. She appears well-developed and well-nourished. No distress.  Eyes: EOM are normal.  Cardiovascular: Intact  distal pulses.   Pulmonary/Chest: Effort normal.  Neurological: She is alert and oriented to person, place, and time.  Skin: She is not diaphoretic.  Psychiatric: She has a normal mood and affect. Her behavior is normal.    Ortho Exam  5 out of 5 strengths throughout the lower extremities against resistance. She comes within 2 inches of phenol touch her toes. She has limited extension of the lumbar spine. Tenderness over the lumbar spinal column distally. Straight leg raise positive on the left negative on the right. Deep tendon reflexes are 2+ at the knees and 1+ at the ankles and equal and symmetric. Sensation grossly intact bilateral feet to light touch. Bilateral knees she is slight crepitus with passive  range of motion both knees no instability valgus varus stressing. Nontender along medial lateral joint line of either knee. No effusion abnormal warmth erythema of either knee. Full range of motion of both knees. Left knee with peripatellar tenderness. Specialty Comments:  No specialty comments available.  Imaging: Xr Knee 1-2 Views Left  Result Date: 05/28/2017 Left knee AP lateral views: Medial lateral joint lines well-maintained. Mild patellofemoral changes. No acute fractures. No subluxation dislocation knee. No bony lesions or bony abnormalities.  Xr Lumbar Spine 2-3 Views  Result Date: 05/28/2017 Lumbar spine 2 views: No acute fracture. Mild L4-L5 anterior spinal listhesis and grade 1 L5 on S1 spinal listhesis. Decreased disc space at L4-5 and L5-S1.    PMFS History: Patient Active Problem List   Diagnosis Date Noted  . Diverticulosis of colon without hemorrhage 12/02/2016  . GERD (gastroesophageal reflux disease) 12/02/2016  . Metatarsalgia of left foot 07/07/2015  . Metatarsal deformity 07/07/2015  . Injury of left foot 07/07/2015   Past Medical History:  Diagnosis Date  . Allergy   . Arthritis   . Cataract    left eye surgery  . Chronic back pain   . Chronic leg pain   . Diverticulosis   . Esophagitis   . GERD (gastroesophageal reflux disease)   . Hyperlipidemia   . Hypertension     Family History  Problem Relation Age of Onset  . Diverticulosis Mother   . Breast cancer Mother   . Irritable bowel syndrome Mother   . Thyroid disease Mother   . Arthritis Mother   . Dementia Mother   . Thyroid cancer Brother   . Lung cancer Brother   . Colon cancer Neg Hx   . Esophageal cancer Neg Hx   . Stomach cancer Neg Hx   . Allergic rhinitis Neg Hx   . Angioedema Neg Hx   . Asthma Neg Hx   . Eczema Neg Hx   . Immunodeficiency Neg Hx   . Urticaria Neg Hx     Past Surgical History:  Procedure Laterality Date  . ABDOMINAL HYSTERECTOMY     partial  . BREAST  RECONSTRUCTION Right    and cyst removal-right  . CATARACT EXTRACTION Left   . CHOLECYSTECTOMY    . DILATION AND CURETTAGE OF UTERUS    . TONSILLECTOMY     Social History   Occupational History  . retired [employer]   Social History Main Topics  . Smoking status: Former Smoker    Years: 3.00  . Smokeless tobacco: Never Used  . Alcohol use No  . Drug use: No  . Sexual activity: Not on file

## 2017-05-29 NOTE — Telephone Encounter (Signed)
FYI

## 2017-06-03 DIAGNOSIS — N952 Postmenopausal atrophic vaginitis: Secondary | ICD-10-CM | POA: Diagnosis not present

## 2017-06-09 ENCOUNTER — Other Ambulatory Visit: Payer: Self-pay | Admitting: Gastroenterology

## 2017-06-09 NOTE — Telephone Encounter (Signed)
Done.  Refilled for once daily with refills

## 2017-06-09 NOTE — Telephone Encounter (Signed)
Last seen 12-2016. Refill Protonix 40 mg once a day. Contacted pt in follow up to tapering off the ppi. She states she is doing one a day Protonix in the AM. She did taper off but couldn't not have it. She still has Rx she is currently using as her initial RX was for BID dosing. Please advise on refills.

## 2017-06-16 ENCOUNTER — Telehealth: Payer: Self-pay

## 2017-06-16 DIAGNOSIS — E784 Other hyperlipidemia: Secondary | ICD-10-CM | POA: Diagnosis not present

## 2017-06-16 MED ORDER — PANTOPRAZOLE SODIUM 40 MG PO TBEC: 40.0000 mg | DELAYED_RELEASE_TABLET | Freq: Two times a day (BID) | ORAL | 3 refills | Status: DC

## 2017-06-16 NOTE — Telephone Encounter (Signed)
Refill request for pantoprazole 40 mg. One po BID. Last seen 12-2016.

## 2017-06-16 NOTE — Telephone Encounter (Signed)
done

## 2017-07-08 DIAGNOSIS — N951 Menopausal and female climacteric states: Secondary | ICD-10-CM | POA: Diagnosis not present

## 2017-07-14 ENCOUNTER — Ambulatory Visit: Payer: Medicare HMO | Admitting: Allergy

## 2017-08-09 DIAGNOSIS — R69 Illness, unspecified: Secondary | ICD-10-CM | POA: Diagnosis not present

## 2017-08-11 ENCOUNTER — Ambulatory Visit: Payer: Medicare HMO | Admitting: Allergy

## 2017-08-20 ENCOUNTER — Ambulatory Visit (INDEPENDENT_AMBULATORY_CARE_PROVIDER_SITE_OTHER): Payer: Medicare HMO | Admitting: Allergy

## 2017-08-20 ENCOUNTER — Encounter: Payer: Self-pay | Admitting: Allergy

## 2017-08-20 VITALS — BP 112/66 | HR 76 | Resp 16

## 2017-08-20 DIAGNOSIS — H1010 Acute atopic conjunctivitis, unspecified eye: Secondary | ICD-10-CM | POA: Diagnosis not present

## 2017-08-20 DIAGNOSIS — J309 Allergic rhinitis, unspecified: Secondary | ICD-10-CM

## 2017-08-20 NOTE — Addendum Note (Signed)
Addended by: Lorrin MaisPADGETT, SHAYLAR P on: 08/20/2017 04:49 PM   Modules accepted: Level of Service

## 2017-08-20 NOTE — Patient Instructions (Addendum)
Allergic Rhinoconjunctivitis - Continue loratidine 10 mg every night to see if this helps. Try taking this early in the evening to decrease morning sleepiness - Continue Nasonex nasal spray- 1 spray in each nostril daily - Consider saline nasal spray daily before Nasonex - Begin over the counter antihistamine eye drops. Aloway and Zaditor are examples  Follow up in 6 months or sooner if needed

## 2017-08-20 NOTE — Progress Notes (Signed)
   [address] [place] [address] Dept: [phone]  FAMILY NURSE PRACTITIONER FOLLOW UP NOTE  Patient ID: Donna BattyRebecca S Gellis, female    DOB: [birth year]  Age: 76 y.o. MRN: [id number] Date of Office Visit: 08/20/2017  Assessment  Chief Complaint: Sinus Problem (f/u having sinus headaches)  HPI Donna BattyRebecca S Flaim is a 76 year old female who presents to the clinic today for a follow up evaluation of allergic rhinoconjunctivitis. She was last seen in this clinic on 12/30/2016 by Dr. Delorse LekPadgett. At that time she was positive to dust mites, mold, tree pollen, and dog on skin and intradermal testing. She was started on an oral antihistamine, nasal steroid, saline nasal rinses, and antihistamine eye drops.   Since that visit, she reports she has nasal congestion, runny nose, itchy eyes, and scratchy throat when she goes to her job. She works 2-3 days a week for 9 hours at a time, in a house with a dog, caring for a person with Alzheimer's. She reports her symptoms begin when she gets to work and worsen the longer she is there. She has recently been taking loratadine 10 mg as needed as well as using a steroid nasal spray as needed with some relief. She reports taking the loratadine at night and feeling groggy on the mornings after she has taken it. She is not currently using any nasal saline spray or eye drops.    Drug Allergies:  Allergies  Allergen Reactions  . No Known Allergies     Physical Exam: BP 112/66 (BP Location: Left Arm, Patient Position: Sitting, Cuff Size: Normal)   Pulse 76   Resp 16    Physical Exam  Constitutional: She is oriented to person, place, and time. She appears well-developed and well-nourished.  HENT:  Right Ear: External ear normal.  Left Ear: External ear normal.  Pharynx erythematous with cobblestone appearance. Nares erythematous and edematous with clear drainage noted.  Eyes: Conjunctivae are normal.  Neck: Normal range of motion. Neck supple.    Cardiovascular: Normal rate, regular rhythm and normal heart sounds.   Pulmonary/Chest: Effort normal and breath sounds normal.  Musculoskeletal: Normal range of motion.  Neurological: She is alert and oriented to person, place, and time.  Skin: Skin is warm and dry.  Psychiatric: She has a normal mood and affect. Her behavior is normal.     Assessment and Plan: 1. Allergic rhinoconjunctivitis     No orders of the defined types were placed in this encounter.   Patient Instructions  Allergic Rhinoconjunctivitis - Continue loratidine 10 mg every night to see if this helps. Try taking this early in the evening to decrease morning sleepiness - Continue Nasonex nasal spray- 1 spray in each nostril daily - Consider saline nasal spray daily before Nasonex - Begin over the counter antihistamine eye drops. Aloway and Zaditor are examples  Follow up in 6 months or sooner if needed    Return in about 6 months (around 02/18/2018), or if symptoms worsen or fail to improve.    Thank you for the opportunity to care for this patient.  Please do not hesitate to contact me with questions.  Thermon LeylandAnne Justin Meisenheimer, FNP [facility]

## 2017-08-26 DIAGNOSIS — I10 Essential (primary) hypertension: Secondary | ICD-10-CM | POA: Diagnosis not present

## 2017-08-26 DIAGNOSIS — M47819 Spondylosis without myelopathy or radiculopathy, site unspecified: Secondary | ICD-10-CM | POA: Diagnosis not present

## 2017-08-26 DIAGNOSIS — Z0000 Encounter for general adult medical examination without abnormal findings: Secondary | ICD-10-CM | POA: Diagnosis not present

## 2017-08-26 DIAGNOSIS — J3081 Allergic rhinitis due to animal (cat) (dog) hair and dander: Secondary | ICD-10-CM | POA: Diagnosis not present

## 2017-08-26 DIAGNOSIS — M519 Unspecified thoracic, thoracolumbar and lumbosacral intervertebral disc disorder: Secondary | ICD-10-CM | POA: Diagnosis not present

## 2017-08-26 DIAGNOSIS — K219 Gastro-esophageal reflux disease without esophagitis: Secondary | ICD-10-CM | POA: Diagnosis not present

## 2017-08-26 DIAGNOSIS — H9319 Tinnitus, unspecified ear: Secondary | ICD-10-CM | POA: Diagnosis not present

## 2017-08-26 DIAGNOSIS — J309 Allergic rhinitis, unspecified: Secondary | ICD-10-CM | POA: Diagnosis not present

## 2017-08-26 DIAGNOSIS — J3089 Other allergic rhinitis: Secondary | ICD-10-CM | POA: Diagnosis not present

## 2017-08-26 DIAGNOSIS — K5900 Constipation, unspecified: Secondary | ICD-10-CM | POA: Diagnosis not present

## 2017-10-15 DIAGNOSIS — R8290 Unspecified abnormal findings in urine: Secondary | ICD-10-CM | POA: Diagnosis not present

## 2017-10-15 DIAGNOSIS — R300 Dysuria: Secondary | ICD-10-CM | POA: Diagnosis not present

## 2017-10-15 DIAGNOSIS — R69 Illness, unspecified: Secondary | ICD-10-CM | POA: Diagnosis not present

## 2017-10-15 DIAGNOSIS — N390 Urinary tract infection, site not specified: Secondary | ICD-10-CM | POA: Diagnosis not present

## 2017-10-15 DIAGNOSIS — J019 Acute sinusitis, unspecified: Secondary | ICD-10-CM | POA: Diagnosis not present

## 2017-10-15 DIAGNOSIS — Z658 Other specified problems related to psychosocial circumstances: Secondary | ICD-10-CM | POA: Diagnosis not present

## 2017-10-29 DIAGNOSIS — R300 Dysuria: Secondary | ICD-10-CM | POA: Diagnosis not present

## 2017-11-18 ENCOUNTER — Telehealth: Payer: Self-pay

## 2017-11-18 NOTE — Telephone Encounter (Signed)
Incoming fax request from [facility] for Protonix 40 mg QD. Last seen 11-2016

## 2017-11-19 ENCOUNTER — Other Ambulatory Visit: Payer: Self-pay | Admitting: Gastroenterology

## 2017-11-19 MED ORDER — PANTOPRAZOLE SODIUM 40 MG PO TBEC: 40.0000 mg | DELAYED_RELEASE_TABLET | Freq: Every day | ORAL | 1 refills | Status: DC

## 2017-11-19 NOTE — Telephone Encounter (Signed)
Pt contacted and aware of refills. Follow up scheduled for 01-07-2018.

## 2017-11-19 NOTE — Telephone Encounter (Signed)
Refilled for once daily x 2 months. Please have her see me within next 2 months if I am to continue prescribing.

## 2017-11-25 ENCOUNTER — Ambulatory Visit (INDEPENDENT_AMBULATORY_CARE_PROVIDER_SITE_OTHER): Payer: Medicare HMO | Admitting: Orthopaedic Surgery

## 2017-11-25 ENCOUNTER — Encounter (INDEPENDENT_AMBULATORY_CARE_PROVIDER_SITE_OTHER): Payer: Self-pay | Admitting: Orthopaedic Surgery

## 2017-11-25 ENCOUNTER — Other Ambulatory Visit (INDEPENDENT_AMBULATORY_CARE_PROVIDER_SITE_OTHER): Payer: Self-pay

## 2017-11-25 DIAGNOSIS — M5441 Lumbago with sciatica, right side: Secondary | ICD-10-CM | POA: Diagnosis not present

## 2017-11-25 DIAGNOSIS — G8929 Other chronic pain: Secondary | ICD-10-CM | POA: Diagnosis not present

## 2017-11-25 NOTE — Progress Notes (Signed)
The patient is someone we have seen in the past and has a known history of low back pain with right-sided radicular symptoms.  She had seen Dr. Alvester MorinNewton in the past and he had performed epidural steroid injections to the right side.  We saw her in August of this past year and x-rays were obtained of the lumbar spine.  We reviewed these today and it shows loss of disc height at L4-L5 and L5-S1 with slight anterolisthesis.  The MRI from 2016 of her lumbar spine showed significant disc bulges at L4-L5 and L5-S1 to the right and we went over those again today.  Her symptoms are still to the right side and again on her backside some.  It does not go down to her foot and she is requesting epidural steroids again because that helped her quite a bit.  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  She denies any change in bowel bladder function or any saddle anesthesia.  She denies any weakness in her legs.  She does not walk with assistive device either.  On exam she is very comfortable appearing.  She has negative straight leg raise bilaterally.  She has pain in the lower aspect of her lumbar spine to the right side with good flexion-extension and there is some slight radicular symptoms into the sciatic region.  This point I do feel that it is worth setting her up to see Dr. Alvester MorinNewton again for a right-sided likely L4-L5 injection versus L5-S1 to the right.  We will work on setting that appointment up in a month see her back in a month to see how she is doing overall.  All questions concerns were answered and addressed.

## 2017-11-27 ENCOUNTER — Telehealth (INDEPENDENT_AMBULATORY_CARE_PROVIDER_SITE_OTHER): Payer: Self-pay | Admitting: Orthopaedic Surgery

## 2017-11-27 NOTE — Telephone Encounter (Signed)
Please call pt on work phone she missed both calls to sched her appt. Pt came into to the office around 4:30pm.

## 2017-12-02 DIAGNOSIS — H04123 Dry eye syndrome of bilateral lacrimal glands: Secondary | ICD-10-CM | POA: Diagnosis not present

## 2017-12-02 DIAGNOSIS — H25811 Combined forms of age-related cataract, right eye: Secondary | ICD-10-CM | POA: Diagnosis not present

## 2017-12-02 DIAGNOSIS — H02831 Dermatochalasis of right upper eyelid: Secondary | ICD-10-CM | POA: Diagnosis not present

## 2017-12-09 ENCOUNTER — Ambulatory Visit (INDEPENDENT_AMBULATORY_CARE_PROVIDER_SITE_OTHER): Payer: Medicare HMO | Admitting: Physical Medicine and Rehabilitation

## 2017-12-09 ENCOUNTER — Encounter (INDEPENDENT_AMBULATORY_CARE_PROVIDER_SITE_OTHER): Payer: Self-pay | Admitting: Physical Medicine and Rehabilitation

## 2017-12-09 ENCOUNTER — Ambulatory Visit (INDEPENDENT_AMBULATORY_CARE_PROVIDER_SITE_OTHER): Payer: Self-pay

## 2017-12-09 VITALS — BP 117/73 | HR 84 | Temp 97.8°F

## 2017-12-09 DIAGNOSIS — M5416 Radiculopathy, lumbar region: Secondary | ICD-10-CM

## 2017-12-09 MED ORDER — BETAMETHASONE SOD PHOS & ACET 6 (3-3) MG/ML IJ SUSP
12.0000 mg | Freq: Once | INTRAMUSCULAR | Status: AC
  Administered 2017-12-09: 12 mg

## 2017-12-09 NOTE — Progress Notes (Deleted)
Pt states an aching pain in lower back that radiates down the right leg. Pt states pain has been going on since December 2018. Pt states lifting makes pain worse, medication and cream eases pain. +Driver, -BT, -Dye Allergies.

## 2017-12-09 NOTE — Patient Instructions (Signed)

## 2017-12-23 NOTE — Progress Notes (Signed)
Donna Lynn - 77 y.o. female MRN [id number]  Date of birth: [birth year]  Office Visit Note: Visit Date: 12/09/2017 PCP: Martha ClanShaw, William, MD Referred by: Martha ClanShaw, William, MD  Subjective: Chief Complaint  Patient presents with  . Lower Back - Pain  . Right Leg - Pain   HPI: Donna Lynn is a young appearing 77 year old female that we have seen in the past for epidural injection.  Most recently this year she has been followed by Dr. Magnus IvanBlackman and Donna Lynn.  She had some ongoing issues to the fall with her back and hip and leg.  She comes in today with aching pain in the lower back that radiates down the right leg in a pretty classic L5 distribution.  She reports really worsening since December 2018 but without specific injury.  She does report increasing symptoms including lifting.  She does use an anti-inflammatory cream that seems to help and she does use some medication.  She has had an MRI from 2016 and that is reviewed again below.  She has not had back surgery.  She does remain very active and is primary caregiver.    ROS Otherwise per HPI.  Assessment & Plan: Visit Diagnoses:  1. Lumbar radiculopathy     Plan: Findings:  Diagnostic and hopefully therapeutic right L5 transforaminal epidural steroid injection.    Meds & Orders:  Meds ordered this encounter  Medications  . betamethasone acetate-betamethasone sodium phosphate (CELESTONE) injection 12 mg    Orders Placed This Encounter  Procedures  . XR C-ARM NO REPORT  . Epidural Steroid injection    Follow-up: Return if symptoms worsen or fail to improve.   Procedures: No procedures performed  Lumbosacral Transforaminal Epidural Steroid Injection - Sub-Pedicular Approach with Fluoroscopic Guidance  Patient: Donna Lynn      Date of Birth: [birth year] MRN: [id number] PCP: Martha ClanShaw, William, MD      Visit Date: 12/09/2017   Universal Protocol:    Date/Time: 12/09/2017  Consent Given By: the patient  Position:  PRONE  Additional Comments: Vital signs were monitored before and after the procedure. Patient was prepped and draped in the usual sterile fashion. The correct patient, procedure, and site was verified.   Injection Procedure Details:  Procedure Site One Meds Administered:  Meds ordered this encounter  Medications  . betamethasone acetate-betamethasone sodium phosphate (CELESTONE) injection 12 mg    Laterality: Right  Location/Site:  L5-S1  Needle size: 22 G  Needle type: Spinal  Needle Placement: Transforaminal  Findings:    -Comments: Excellent flow of contrast along the nerve and into the epidural space.  Procedure Details: After squaring off the end-plates to get a true AP view, the C-arm was positioned so that an oblique view of the foramen as noted above was visualized. The target area is just inferior to the "nose of the scotty dog" or sub pedicular. The soft tissues overlying this structure were infiltrated with 2-3 ml. of 1% Lidocaine without Epinephrine.  The spinal needle was inserted toward the target using a "trajectory" view along the fluoroscope beam.  Under AP and lateral visualization, the needle was advanced so it did not puncture dura and was located close the 6 O'Clock position of the pedical in AP tracterory. Biplanar projections were used to confirm position. Aspiration was confirmed to be negative for CSF and/or blood. A 1-2 ml. volume of Isovue-250 was injected and flow of contrast was noted at each level. Radiographs were obtained for documentation purposes.   After  attaining the desired flow of contrast documented above, a 0.5 to 1.0 ml test dose of 0.25% Marcaine was injected into each respective transforaminal space.  The patient was observed for 90 seconds post injection.  After no sensory deficits were reported, and normal lower extremity motor function was noted,   the above injectate was administered so that equal amounts of the injectate were placed  at each foramen (level) into the transforaminal epidural space.   Additional Comments:  The patient tolerated the procedure well Dressing: Band-Aid    Post-procedure details: Patient was observed during the procedure. Post-procedure instructions were reviewed.  Patient left the clinic in stable condition.    Clinical History: MRI LUMBAR SPINE WITHOUT CONTRAST  TECHNIQUE: Multiplanar, multisequence MR imaging of the lumbar spine was performed. No intravenous contrast was administered.  COMPARISON:  Lumbar MRI 01/10/2009.  FINDINGS: Same numbering system as on the comparison designating normal lumbar segmentation. Mild progression of grade 1 anterolisthesis at both L4-L5 and L5-S1 since 2010. Stable and normal vertebral height and alignment elsewhere. No marrow edema or evidence of acute osseous abnormality.  Visualized lower thoracic spinal cord is normal with conus medularis at L1.  Visualized abdominal viscera and paraspinal soft tissues are within normal limits.  Less than less than 3 cm right hemipelvis simple appearing cyst (series 5, image 4) no followup indicated per ACR consensus guidelines: White Paper of the ACR Incidental Findings Committee II on Adnexal Findings. J Am Coll Radiol [phone].  T11-T12: Minimal disc bulge and mild posterior element hypertrophy. Mild right T11 foraminal stenosis is stable.  T12-L1:  Negative.  L1-L2: Stable minimal disc bulge and facet hypertrophy with no stenosis.  L2-L3: Stable minimal disc bulge and facet hypertrophy with no stenosis.  L3-L4: Mildly increased circumferential disc bulge. Moderate facet and ligament flavum hypertrophy also mildly increased. Borderline to mild lateral recess stenosis and biforaminal stenosis is new. No significant spinal stenosis.  L4-L5: Progressed disc desiccation and disc space loss. Chronic circumferential disc bulge. Chronic severe facet and ligament  flavum hypertrophy. Regressed right side synovial cyst and bilateral facet joint fluid. Mild to moderate spinal stenosis with severe bilateral lateral recess stenosis has mildly increased (series 6, image 22). Mild L4 foraminal stenosis is stable.  L5-S1: Progressed chronic disc space loss with increased vacuum disc. Chronic circumferential disc osteophyte complex eccentric to the right. Progressed severe facet hypertrophy with persistent facet joint fluid. Mildly increased ligament flavum hypertrophy. Still, no significant spinal stenosis. Mild new lateral recess stenosis greater on the left. Mild to moderate bilateral L5 foraminal stenosis is increased.  IMPRESSION: 1. Mildly progressed grade 1 spondylolisthesis at both L4-L5 and L5-S1 since 2010 with associated progressed disc and posterior element degeneration. Increased mild to moderate spinal stenosis at at L4-L5 with severe lateral recess stenosis, and new mild lateral recess stenosis at L5-S1 and increased mild to moderate foraminal stenosis. 2. Progressed disc and posterior element degeneration at L3-L4 with new borderline to mild lateral recess and foraminal stenosis.   Electronically Signed   By: Odessa Fleming M.D.   On: 02/11/2015 17:41  She reports that she has quit smoking. She quit after 3.00 years of use. she has never used smokeless tobacco. No results for input(s): HGBA1C, LABURIC in the last 8760 hours.  Objective:  VS:  HT:    WT:   BMI:     BP:117/73  HR:84bpm  TEMP:97.8 F (36.6 C)(Oral)  RESP:96 % Physical Exam  Musculoskeletal:  Patient ambulates without aid with good distal  strength.    Ortho Exam Imaging: No results found.  Past Medical/Family/Surgical/Social History: Medications & Allergies reviewed per EMR Patient Active Problem List   Diagnosis Date Noted  . Allergic rhinoconjunctivitis 08/20/2017  . Diverticulosis of colon without hemorrhage 12/02/2016  . GERD (gastroesophageal reflux  disease) 12/02/2016  . Metatarsalgia of left foot 07/07/2015  . Metatarsal deformity 07/07/2015  . Injury of left foot 07/07/2015   Past Medical History:  Diagnosis Date  . Allergy   . Arthritis   . Cataract    left eye surgery  . Chronic back pain   . Chronic leg pain   . Diverticulosis   . Esophagitis   . GERD (gastroesophageal reflux disease)   . Hyperlipidemia   . Hypertension    Family History  Problem Relation Age of Onset  . Diverticulosis Mother   . Breast cancer Mother   . Irritable bowel syndrome Mother   . Thyroid disease Mother   . Arthritis Mother   . Dementia Mother   . Thyroid cancer Brother   . Lung cancer Brother   . Colon cancer Neg Hx   . Esophageal cancer Neg Hx   . Stomach cancer Neg Hx   . Allergic rhinitis Neg Hx   . Angioedema Neg Hx   . Asthma Neg Hx   . Eczema Neg Hx   . Immunodeficiency Neg Hx   . Urticaria Neg Hx    Past Surgical History:  Procedure Laterality Date  . ABDOMINAL HYSTERECTOMY     partial  . BREAST RECONSTRUCTION Right    and cyst removal-right  . CATARACT EXTRACTION Left   . CHOLECYSTECTOMY    . DILATION AND CURETTAGE OF UTERUS    . TONSILLECTOMY     Social History   Occupational History  . Occupation: retired    Associate Professor: [employer]  Tobacco Use  . Smoking status: Former Smoker    Years: 3.00  . Smokeless tobacco: Never Used  Substance and Sexual Activity  . Alcohol use: No    Alcohol/week: 0.0 oz  . Drug use: No  . Sexual activity: Not on file

## 2017-12-23 NOTE — Procedures (Signed)
Lumbosacral Transforaminal Epidural Steroid Injection - Sub-Pedicular Approach with Fluoroscopic Guidance  Patient: Donna Lynn      Date of Birth: [birth year] MRN: [id number] PCP: Donna Lynn, William, MD      Visit Date: 12/09/2017   Universal Protocol:    Date/Time: 12/09/2017  Consent Given By: the patient  Position: PRONE  Additional Comments: Vital signs were monitored before and after the procedure. Patient was prepped and draped in the usual sterile fashion. The correct patient, procedure, and site was verified.   Injection Procedure Details:  Procedure Site One Meds Administered:  Meds ordered this encounter  Medications  . betamethasone acetate-betamethasone sodium phosphate (CELESTONE) injection 12 mg    Laterality: Right  Location/Site:  L5-S1  Needle size: 22 G  Needle type: Spinal  Needle Placement: Transforaminal  Findings:    -Comments: Excellent flow of contrast along the nerve and into the epidural space.  Procedure Details: After squaring off the end-plates to get a true AP view, the C-arm was positioned so that an oblique view of the foramen as noted above was visualized. The target area is just inferior to the "nose of the scotty dog" or sub pedicular. The soft tissues overlying this structure were infiltrated with 2-3 ml. of 1% Lidocaine without Epinephrine.  The spinal needle was inserted toward the target using a "trajectory" view along the fluoroscope beam.  Under AP and lateral visualization, the needle was advanced so it did not puncture dura and was located close the 6 O'Clock position of the pedical in AP tracterory. Biplanar projections were used to confirm position. Aspiration was confirmed to be negative for CSF and/or blood. A 1-2 ml. volume of Isovue-250 was injected and flow of contrast was noted at each level. Radiographs were obtained for documentation purposes.   After attaining the desired flow of contrast documented above, a 0.5 to  1.0 ml test dose of 0.25% Marcaine was injected into each respective transforaminal space.  The patient was observed for 90 seconds post injection.  After no sensory deficits were reported, and normal lower extremity motor function was noted,   the above injectate was administered so that equal amounts of the injectate were placed at each foramen (level) into the transforaminal epidural space.   Additional Comments:  The patient tolerated the procedure well Dressing: Band-Aid    Post-procedure details: Patient was observed during the procedure. Post-procedure instructions were reviewed.  Patient left the clinic in stable condition.

## 2017-12-29 ENCOUNTER — Ambulatory Visit (INDEPENDENT_AMBULATORY_CARE_PROVIDER_SITE_OTHER): Payer: Medicare HMO | Admitting: Orthopaedic Surgery

## 2018-01-06 NOTE — Progress Notes (Signed)
[facility] Progress Note  Chief Complaint: GERD  Subjective  History:  This is a 77 year old woman I last saw over a year ago for GERD and dysphagia.  She had previously seen Dr. Juanda ChanceBrodie - last EGD 11/15.  After that visit she also developed some proctalgia that reportedly improved with Preparation H suppositories.  Colonoscopy March 2018 was unremarkable.  Donna JoinerRebecca is here to see me regarding her GERD symptoms.  After our last visit, she seems to feel that I wanted her to stop her acid suppression out of concern for long-term safety of this medicine.  Therefore, she stopped it but has had worsened reflux symptoms with nocturnal regurgitation, pyrosis, frequent upper abdominal bloating belching and dysphagia.  ROS: Cardiovascular:  no chest pain Respiratory: no dyspnea Remainder systems negative except as above The patient's Past Medical, Family and Social History were reviewed and are on file in the EMR.  Objective:  Med list reviewed  Current Outpatient Medications:  .  Azelastine HCl 0.15 % SOLN, Place 2 sprays into both nostrils 2 (two) times daily., Disp: 30 mL, Rfl: 5 .  docusate sodium (COLACE) 100 MG capsule, Take 100 mg by mouth daily., Disp: , Rfl:  .  estradiol (ESTRACE VAGINAL) 0.1 MG/GM vaginal cream, Estrace 0.01% (0.1 mg/gram) vaginal cream  Insert 1 g twice a week by vaginal route at bedtime., Disp: , Rfl:  .  estradiol (ESTRACE) 0.5 MG tablet, estradiol 0.5 mg tablet  TAKE 1 TABLET BY MOUTH EVERY DAY, Disp: , Rfl:  .  hydrocortisone (ANUSOL-HC) 25 MG suppository, Use on 2 times a week for rectal pain as needed, Disp: 20 suppository, Rfl: 1 .  irbesartan (AVAPRO) 75 MG tablet, Take 75 mg by mouth daily., Disp: , Rfl:  .  loratadine (CLARITIN) 10 MG tablet, Take 10 mg by mouth daily., Disp: , Rfl:  .  Multiple Vitamins-Minerals (MULTIVITAMIN ADULT EXTRA C PO), multivitamin, Disp: , Rfl:  .  Olopatadine HCl (PAZEO) 0.7 % SOLN, Place 1 drop into both eyes daily  as needed. As needed for itchy, red or watery eyes., Disp: 1 Bottle, Rfl: 5 .  pantoprazole (PROTONIX) 40 MG tablet, TAKE 1 TABLET(40 MG) BY MOUTH DAILY, Disp: 90 tablet, Rfl: 1 .  Probiotic Product (PROBIOTIC PO), Take 1 tablet by mouth daily., Disp: , Rfl:  .  Vitamin E 100 UNIT/GM CREA, vitamin E  400 units, Disp: , Rfl:   Current Facility-Administered Medications:  .  0.9 %  sodium chloride infusion, 500 mL, Intravenous, Continuous, Danis, Starr LakeHenry L III, MD   Vital signs in last 24 hrs: Vitals:   01/07/18 1040  BP: 106/64  Pulse: 88    Physical Exam   HEENT: sclera anicteric, oral mucosa moist without lesions, normal vocal quality  Neck: supple, no thyromegaly, JVD or lymphadenopathy  Cardiac: RRR without murmurs, S1S2 heard, no peripheral edema  Pulm: clear to auscultation bilaterally, normal RR and effort noted  Abdomen: soft, mild epigastric tenderness, with active bowel sounds. No guarding or palpable hepatosplenomegaly.  Skin; warm and dry, no jaundice or rash   @ASSESSMENTPLANBEGIN @ Assessment: Encounter Diagnoses  Name Primary?  . Gastroesophageal reflux disease without esophagitis Yes  . Esophageal dysphagia   . Belching   . Abdominal pain, chronic, epigastric   . Abdominal bloating   . Gastro-esophageal reflux disease without esophagitis    Her heartburn and dysphagia have definitely worsened off PPI.  I clarified my feelings regarding this medication.  Specifically, I only wanted to see  if she was able to tolerate a less frequent or lower dosing regimen in order to assess her symptoms and the true need for these medicines, not because of long-term safety concerns. She had an upper endoscopy with Dr. Juanda Chance in 2015 where an empiric bougie dilation was performed.  The patient recalls having had improvement of dysphagia for some period of time afterwards.  She does not have risk factors for gastroparesis, and I still suspect that her bloating and upper abdominal  pain are most likely functional.   Plan:  Upper endoscopy with possible dilation Resume Protonix 40 mg once daily before breakfast.  Can be reduced to before supper meal if she is mostly having nighttime symptoms. Gastric emptying study   Total time 30 minutes, over half spent in counseling and coordination of care.   Charlie Pitter III

## 2018-01-07 ENCOUNTER — Other Ambulatory Visit: Payer: Self-pay | Admitting: Gastroenterology

## 2018-01-07 ENCOUNTER — Encounter: Payer: Self-pay | Admitting: Gastroenterology

## 2018-01-07 ENCOUNTER — Ambulatory Visit: Payer: Medicare HMO | Admitting: Gastroenterology

## 2018-01-07 VITALS — BP 106/64 | HR 88 | Ht 61.25 in | Wt 122.2 lb

## 2018-01-07 DIAGNOSIS — R1310 Dysphagia, unspecified: Secondary | ICD-10-CM

## 2018-01-07 DIAGNOSIS — R140 Abdominal distension (gaseous): Secondary | ICD-10-CM | POA: Diagnosis not present

## 2018-01-07 DIAGNOSIS — G8929 Other chronic pain: Secondary | ICD-10-CM | POA: Diagnosis not present

## 2018-01-07 DIAGNOSIS — K219 Gastro-esophageal reflux disease without esophagitis: Secondary | ICD-10-CM

## 2018-01-07 DIAGNOSIS — R1013 Epigastric pain: Secondary | ICD-10-CM | POA: Diagnosis not present

## 2018-01-07 DIAGNOSIS — R142 Eructation: Secondary | ICD-10-CM | POA: Diagnosis not present

## 2018-01-07 DIAGNOSIS — R1319 Other dysphagia: Secondary | ICD-10-CM

## 2018-01-07 NOTE — Patient Instructions (Addendum)
If you are age 75 or older, your body mass index should be between 23-30. Your Body mass index is 22.9 kg/m. If this is out of the aforementioned range listed, please consider follow up with your Primary Care Provider.  If you are age 75 or younger, your body mass index should be between 19-25. Your Body mass index is 22.9 kg/m. If this is out of the aformentioned range listed, please consider follow up with your Primary Care Provider.   You have been scheduled for an endoscopy. Please follow written instructions given to you at your visit today. If you use inhalers (even only as needed), please bring them with you on the day of your procedure. Your physician has requested that you go to www.startemmi.com and enter the access code given to you at your visit today. This web site gives a general overview about your procedure. However, you should still follow specific instructions given to you by our office regarding your preparation for the procedure.  You have been scheduled for [facility] gastric [facility] on 01/13/18 at 08:00am. Please arrive at least 15 minutes prior to your appointment for registration. Please make certain not to have anything to eat or drink after midnight the night before your test. Hold all stomach medications (ex: Zofran, phenergan, Reglan) 48 hours prior to your test. If you need to reschedule your appointment, please contact radiology scheduling at [phone]. _____________________________________________________________________ A gastric-emptying study measures how long it takes for food to move through your stomach. There are several ways to measure stomach emptying. In the most common test, you eat food that contains a small amount of radioactive material. A scanner that detects the movement of the radioactive material is placed over your abdomen to monitor the rate at which food leaves your stomach. This test normally takes about 4 hours to  complete. _____________________________________________________________________   Thank you for choosing [facility]  Dr Amada JupiterHenry Danis III

## 2018-01-13 ENCOUNTER — Ambulatory Visit (HOSPITAL_COMMUNITY)
Admission: RE | Admit: 2018-01-13 | Discharge: 2018-01-13 | Disposition: A | Discharge status: Home / Self Care | Payer: Medicare HMO | Source: Ambulatory Visit | Attending: Gastroenterology | Admitting: Gastroenterology

## 2018-01-13 ENCOUNTER — Ambulatory Visit (HOSPITAL_COMMUNITY): Payer: Medicare HMO

## 2018-01-13 ENCOUNTER — Encounter: Payer: Self-pay | Admitting: Gastroenterology

## 2018-01-13 DIAGNOSIS — K219 Gastro-esophageal reflux disease without esophagitis: Secondary | ICD-10-CM | POA: Insufficient documentation

## 2018-01-13 DIAGNOSIS — R110 Nausea: Secondary | ICD-10-CM | POA: Diagnosis not present

## 2018-01-13 DIAGNOSIS — R140 Abdominal distension (gaseous): Secondary | ICD-10-CM | POA: Diagnosis not present

## 2018-01-13 MED ORDER — TECHNETIUM TC 99M SULFUR COLLOID: 2.0000 | Freq: Once | INTRAVENOUS | Status: DC | PRN

## 2018-01-20 ENCOUNTER — Encounter (INDEPENDENT_AMBULATORY_CARE_PROVIDER_SITE_OTHER): Payer: Self-pay | Admitting: Orthopaedic Surgery

## 2018-01-20 ENCOUNTER — Ambulatory Visit (INDEPENDENT_AMBULATORY_CARE_PROVIDER_SITE_OTHER): Payer: Medicare HMO

## 2018-01-20 ENCOUNTER — Ambulatory Visit (INDEPENDENT_AMBULATORY_CARE_PROVIDER_SITE_OTHER): Payer: Medicare HMO | Admitting: Orthopaedic Surgery

## 2018-01-20 DIAGNOSIS — M7541 Impingement syndrome of right shoulder: Secondary | ICD-10-CM | POA: Diagnosis not present

## 2018-01-20 DIAGNOSIS — M25561 Pain in right knee: Secondary | ICD-10-CM | POA: Diagnosis not present

## 2018-01-20 DIAGNOSIS — G8929 Other chronic pain: Secondary | ICD-10-CM

## 2018-01-20 DIAGNOSIS — M778 Other enthesopathies, not elsewhere classified: Secondary | ICD-10-CM

## 2018-01-20 DIAGNOSIS — M5440 Lumbago with sciatica, unspecified side: Secondary | ICD-10-CM | POA: Diagnosis not present

## 2018-01-20 DIAGNOSIS — M779 Enthesopathy, unspecified: Secondary | ICD-10-CM

## 2018-01-20 MED ORDER — METHYLPREDNISOLONE 4 MG PO TABS: ORAL_TABLET | ORAL | 0 refills | Status: DC

## 2018-01-20 NOTE — Progress Notes (Signed)
Office Visit Note   Patient: Donna Lynn           Date of Birth: [birth year]           MRN: [id number] Visit Date: 01/20/2018              Requested by: Donna Clan, MD [address] [place], [place] [address] PCP: Donna Clan, MD   Assessment & Plan: Visit Diagnoses:  1. Shoulder impingement syndrome, right   2. Chronic pain of right knee   3. Tendinitis of finger of right hand   4. Chronic low back pain with sciatica, sciatica laterality unspecified, unspecified back pain laterality     Plan: Due to her multiple arthralgias and low back pain Medrol Dosepak no NSAIDs while on this.  May obtain a MRI of her lumbar spine if her pain persists despite conservative treatment.  We will have physical therapy for back exercises ,home exercise program with hamstring stretching and core strengthening.  Also have them work on her right knee and right shoulder for range of motion strengthening and home exercise program.  She will follow-up with [place] in 1 month to check her progress lack.  She may benefit from cortisone injection of the shoulder right knee possibly leaving the right index finger.  Questions are encouraged and answered by Donna Lynn and myself.  Follow-Up Instructions: Return in about 1 month (around 02/17/2018).   Orders:  Orders Placed This Encounter  Procedures  . XR Shoulder Right   No orders of the defined types were placed in this encounter.     Procedures: No procedures performed   Clinical Data: No additional findings.   Subjective: Chief Complaint  Patient presents with  . Lower Back - Follow-up    HPI Donna Lynn returns today status post a right L5 transforaminal injection by Donna Lynn on 12/09/2017.  She states this did not help at all.  She is now having radicular symptoms down the lateral aspect of both legs to the knees with some numbness and pain.  She is having achiness in her low back.  She has difficulty falling asleep she thinks that  the pain may awaken early in the mornings.  Had no bowel bladder dysfunction.  She had no new injury to her back.  MRI of her back has performed 2016. She also reports that she is having left knee pain tickly worse with steps.  Began last summer.  We did see her in August of last year x-rays were obtained and showed is some mild changes of the patellofemoral joint.  Offered steroid injection she deferred.  They gave her some quad strengthening exercises.  She states that the pain went away but returned now up to 3 months ago.  She denies any mechanical symptoms or swelling of the knee.  She is tried Tylenol Voltaren gel with some relief in the knee pain.   She also notes right shoulder pain without radicular symptoms down the arm.  No known injury to the right shoulder.  Pain with overhead activity. Right hand pain worse with trying to grip objects.  She notes that she has had a history of triggering middle finger in .  past which went away with an injection.  She is experiencing no triggering of the fingers of the right hand currently. No injury to the right hand .  Review of Systems No Chest pain , SOB, fevers or chills Please see HPI otherwise negative.   Objective: Vital Signs: There  were no vitals taken for this visit.  Physical Exam  Constitutional: She is oriented to person, place, and time. She appears well-developed and well-nourished. No distress.  Pulmonary/Chest: Effort normal.  Neurological: She is alert and oriented to person, place, and time.  Skin: She is not diaphoretic.  Psychiatric: She has a normal mood and affect.    Ortho Exam Bilateral knees good range of motion without pain.  No effusion abnormal warmth erythema of either knee.  No instability of either knee.  She has tenderness peripatellar region the left knee slight tenderness over the pes anserinus area left knee.  Right knee nontender throughout. Bilateral shoulders 5 out of 5 strength with external and internal  rotation against resistance.  She has full forward flexion of the right shoulder.  Empty can test negative bilaterally.  Impingement testing positive on the right. Right hand thumb good range of motion without pain negative grind test.  Nontender over the extensor compartment.  She has tenderness over the flexor tendon at the A1 pulley of the index finger only.  No active triggering.  Remainder the hand is without rashes skin lesions ulcerations or edema.  Radial pulses intact. Specialty Comments:  No specialty comments available.  Imaging: No results found.   PMFS History: Patient Active Problem List   Diagnosis Date Noted  . Allergic rhinoconjunctivitis 08/20/2017  . Diverticulosis of colon without hemorrhage 12/02/2016  . GERD (gastroesophageal reflux disease) 12/02/2016  . Metatarsalgia of left foot 07/07/2015  . Metatarsal deformity 07/07/2015  . Injury of left foot 07/07/2015   Past Medical History:  Diagnosis Date  . Allergy   . Arthritis   . Cataract    left eye surgery  . Chronic back pain   . Chronic leg pain   . Diverticulosis   . Esophagitis   . GERD (gastroesophageal reflux disease)   . Hyperlipidemia   . Hypertension     Family History  Problem Relation Age of Onset  . Diverticulosis Mother   . Breast cancer Mother   . Irritable bowel syndrome Mother   . Thyroid disease Mother   . Arthritis Mother   . Dementia Mother   . Thyroid cancer Brother   . Lung cancer Brother   . Colon cancer Neg Hx   . Esophageal cancer Neg Hx   . Stomach cancer Neg Hx   . Allergic rhinitis Neg Hx   . Angioedema Neg Hx   . Asthma Neg Hx   . Eczema Neg Hx   . Immunodeficiency Neg Hx   . Urticaria Neg Hx     Past Surgical History:  Procedure Laterality Date  . ABDOMINAL HYSTERECTOMY     partial  . BREAST RECONSTRUCTION Right    and cyst removal-right  . CATARACT EXTRACTION Left   . CHOLECYSTECTOMY    . DILATION AND CURETTAGE OF UTERUS    . TONSILLECTOMY     Social  History   Occupational History  . Occupation: retired    Associate Professormployer: [employer]  Tobacco Use  . Smoking status: Former Smoker    Years: 3.00  . Smokeless tobacco: Never Used  Substance and Sexual Activity  . Alcohol use: No    Alcohol/week: 0.0 oz  . Drug use: No  . Sexual activity: Not on file

## 2018-01-21 ENCOUNTER — Other Ambulatory Visit: Payer: Self-pay | Admitting: Gastroenterology

## 2018-01-26 DIAGNOSIS — M25562 Pain in left knee: Secondary | ICD-10-CM | POA: Diagnosis not present

## 2018-01-26 DIAGNOSIS — M25511 Pain in right shoulder: Secondary | ICD-10-CM | POA: Diagnosis not present

## 2018-01-26 DIAGNOSIS — M545 Low back pain: Secondary | ICD-10-CM | POA: Diagnosis not present

## 2018-01-26 DIAGNOSIS — M7541 Impingement syndrome of right shoulder: Secondary | ICD-10-CM | POA: Diagnosis not present

## 2018-01-27 ENCOUNTER — Encounter: Payer: Self-pay | Admitting: Gastroenterology

## 2018-01-27 ENCOUNTER — Ambulatory Visit (AMBULATORY_SURGERY_CENTER): Payer: Medicare HMO | Admitting: Gastroenterology

## 2018-01-27 VITALS — BP 125/62 | HR 73 | Temp 98.0°F | Resp 10 | Ht 61.0 in | Wt 122.0 lb

## 2018-01-27 DIAGNOSIS — K2950 Unspecified chronic gastritis without bleeding: Secondary | ICD-10-CM | POA: Diagnosis not present

## 2018-01-27 DIAGNOSIS — K219 Gastro-esophageal reflux disease without esophagitis: Secondary | ICD-10-CM

## 2018-01-27 DIAGNOSIS — R1310 Dysphagia, unspecified: Secondary | ICD-10-CM | POA: Diagnosis not present

## 2018-01-27 DIAGNOSIS — R1319 Other dysphagia: Secondary | ICD-10-CM

## 2018-01-27 MED ORDER — SODIUM CHLORIDE 0.9 % IV SOLN: 500.0000 mL | Freq: Once | INTRAVENOUS | Status: AC

## 2018-01-27 NOTE — Progress Notes (Signed)
Called to room to assist during endoscopic procedure.  Patient ID and intended procedure confirmed with present staff. Received instructions for my participation in the procedure from the performing physician.  

## 2018-01-27 NOTE — Progress Notes (Signed)
No problems noted in the recovery room. maw 

## 2018-01-27 NOTE — Op Note (Signed)
[facility] Patient Name: Donna Lynn Procedure Date: 01/27/2018 11:23 AM MRN: [id number] Endoscopist: Sherilyn Cooter L. Myrtie Neither , MD Age: 77 Referring MD:  Date of Birth: June 01, 1941 Gender: Female Account #: [id number] Procedure:                Upper GI endoscopy Indications:              Dysphagia, Heartburn, Abdominal bloating (symptoms                            persist despite resuming PPI therapy. recent                            gastric emptying study normal) Medicines:                Monitored Anesthesia Care Procedure:                Pre-Anesthesia Assessment:                           - Prior to the procedure, a History and Physical                            was performed, and patient medications and                            allergies were reviewed. The patient's tolerance of                            previous anesthesia was also reviewed. The risks                            and benefits of the procedure and the sedation                            options and risks were discussed with the patient.                            All questions were answered, and informed consent                            was obtained. Prior Anticoagulants: The patient has                            taken no previous anticoagulant or antiplatelet                            agents. ASA Grade Assessment: II - A patient with                            mild systemic disease. After reviewing the risks                            and benefits, the patient was deemed in  satisfactory condition to undergo the procedure.                           After obtaining informed consent, the endoscope was                            passed under direct vision. Throughout the                            procedure, the patient's blood pressure, pulse, and                            oxygen saturations were monitored continuously. The                            Model GIF-HQ190  [id number]) scope was introduced                            through the mouth, and advanced to the second part                            of duodenum. The upper GI endoscopy was                            accomplished without difficulty. The patient                            tolerated the procedure well. Scope In: Scope Out: Findings:                 The larynx was normal.                           The esophagus was normal.                           A single 5 mm erosion was found in the prepyloric                            region of the stomach. Biopsies were taken with a                            cold forceps for histology. (Sidney protocol).                           The cardia and gastric fundus were normal on                            retroflexion.                           The examined duodenum was normal. Complications:            No immediate complications. Estimated Blood Loss:     Estimated blood loss was minimal. Impression:               -  Normal larynx.                           - Normal esophagus.                           - Erosive gastropathy. Biopsied.                           - Normal examined duodenum. Recommendation:           - Patient has a contact number available for                            emergencies. The signs and symptoms of potential                            delayed complications were discussed with the                            patient. Return to normal activities tomorrow.                            Written discharge instructions were provided to the                            patient.                           - Resume previous diet.                           - Continue present medications.                           - Await pathology results, further plan to follow.  L. Myrtie Neitheranis, MD 01/27/2018 11:47:59 AM This report has been signed electronically.

## 2018-01-27 NOTE — Progress Notes (Signed)
Pt's states no medical or surgical changes since previsit or office visit. 

## 2018-01-27 NOTE — Patient Instructions (Signed)
YOU HAD AN ENDOSCOPIC PROCEDURE TODAY AT THE [facility]:   Refer to the procedure report that was given to you for any specific questions about what was found during the examination.  If the procedure report does not answer your questions, please call your gastroenterologist to clarify.  If you requested that your care partner not be given the details of your procedure findings, then the procedure report has been included in a sealed envelope for you to review at your convenience later.  YOU SHOULD EXPECT: Some feelings of bloating in the abdomen. Passage of more gas than usual.  Walking can help get rid of the air that was put into your GI tract during the procedure and reduce the bloating. If you had a lower endoscopy (such as a colonoscopy or flexible sigmoidoscopy) you may notice spotting of blood in your stool or on the toilet paper. If you underwent a bowel prep for your procedure, you may not have a normal bowel movement for a few days.  Please Note:  You might notice some irritation and congestion in your nose or some drainage.  This is from the oxygen used during your procedure.  There is no need for concern and it should clear up in a day or so.  SYMPTOMS TO REPORT IMMEDIATELY:    Following upper endoscopy (EGD)  Vomiting of blood or coffee ground material  New chest pain or pain under the shoulder blades  Painful or persistently difficult swallowing  New shortness of breath  Fever of 100F or higher  Black, tarry-looking stools  For urgent or emergent issues, a gastroenterologist can be reached at any hour by calling [phone].   DIET:  We do recommend a small meal at first, but then you may proceed to your regular diet.  Drink plenty of fluids but you should avoid alcoholic beverages for 24 hours.  ACTIVITY:  You should plan to take it easy for the rest of today and you should NOT DRIVE or use heavy machinery until tomorrow (because of the sedation medicines used  during the test).    FOLLOW UP: Our staff will call the number listed on your records the next business day following your procedure to check on you and address any questions or concerns that you may have regarding the information given to you following your procedure. If we do not reach you, we will leave a message.  However, if you are feeling well and you are not experiencing any problems, there is no need to return our call.  We will assume that you have returned to your regular daily activities without incident.  If any biopsies were taken you will be contacted by phone or by letter within the next 1-3 weeks.  Please call us at [phone] if you have not heard about the biopsies in 3 weeks.    SIGNATURES/CONFIDENTIALITY: You and/or your care partner have signed paperwork which will be entered into your electronic medical record.  These signatures attest to the fact that that the information above on your After Visit Summary has been reviewed and is understood.  Full responsibility of the confidentiality of this discharge information lies with you and/or your care-partner.    You may resume your current medications today. Await biopsy results. Please call if any questions or concerns.

## 2018-01-27 NOTE — Progress Notes (Signed)
A and O x3. Report to RN. Tolerated MAC anesthesia well.Teeth unchanged after procedure.

## 2018-01-28 ENCOUNTER — Telehealth: Payer: Self-pay

## 2018-01-28 ENCOUNTER — Telehealth: Payer: Self-pay | Admitting: *Deleted

## 2018-01-28 NOTE — Telephone Encounter (Signed)
  Follow up Call-  Call back number 01/27/2018 01/13/2017  Post procedure Call Back phone  # [phone]  Permission to leave phone message Yes Yes  Some recent data might be hidden    LMOM, will call back later today

## 2018-01-28 NOTE — Telephone Encounter (Signed)
  Follow up Call-  Call back number 01/27/2018 01/13/2017  Post procedure Call Back phone  # [phone]  Permission to leave phone message Yes Yes  Some recent data might be hidden     Patient questions:  Do you have a fever, pain , or abdominal swelling? No. Pain Score  0 *  Have you tolerated food without any problems? Yes.    Have you been able to return to your normal activities? Yes.    Do you have any questions about your discharge instructions: Diet   No. Medications  No. Follow up visit  No.  Do you have questions or concerns about your Care? No.  Actions: * If pain score is 4 or above: No action needed, pain <4.

## 2018-02-03 DIAGNOSIS — M25511 Pain in right shoulder: Secondary | ICD-10-CM | POA: Diagnosis not present

## 2018-02-03 DIAGNOSIS — M7541 Impingement syndrome of right shoulder: Secondary | ICD-10-CM | POA: Diagnosis not present

## 2018-02-03 DIAGNOSIS — Z1231 Encounter for screening mammogram for malignant neoplasm of breast: Secondary | ICD-10-CM | POA: Diagnosis not present

## 2018-02-03 DIAGNOSIS — M545 Low back pain: Secondary | ICD-10-CM | POA: Diagnosis not present

## 2018-02-03 DIAGNOSIS — Z803 Family history of malignant neoplasm of breast: Secondary | ICD-10-CM | POA: Diagnosis not present

## 2018-02-03 DIAGNOSIS — M25562 Pain in left knee: Secondary | ICD-10-CM | POA: Diagnosis not present

## 2018-02-06 ENCOUNTER — Other Ambulatory Visit: Payer: Self-pay

## 2018-02-06 MED ORDER — DICYCLOMINE HCL 10 MG PO CAPS: ORAL_CAPSULE | ORAL | 1 refills | Status: DC

## 2018-02-10 ENCOUNTER — Encounter: Payer: Self-pay | Admitting: Internal Medicine

## 2018-02-23 DIAGNOSIS — R82998 Other abnormal findings in urine: Secondary | ICD-10-CM | POA: Diagnosis not present

## 2018-02-23 DIAGNOSIS — R7301 Impaired fasting glucose: Secondary | ICD-10-CM | POA: Diagnosis not present

## 2018-02-23 DIAGNOSIS — E7849 Other hyperlipidemia: Secondary | ICD-10-CM | POA: Diagnosis not present

## 2018-02-23 DIAGNOSIS — I10 Essential (primary) hypertension: Secondary | ICD-10-CM | POA: Diagnosis not present

## 2018-02-24 ENCOUNTER — Ambulatory Visit (INDEPENDENT_AMBULATORY_CARE_PROVIDER_SITE_OTHER): Payer: Medicare HMO | Admitting: Orthopaedic Surgery

## 2018-03-02 DIAGNOSIS — R69 Illness, unspecified: Secondary | ICD-10-CM | POA: Diagnosis not present

## 2018-03-02 DIAGNOSIS — Z6822 Body mass index (BMI) 22.0-22.9, adult: Secondary | ICD-10-CM | POA: Diagnosis not present

## 2018-03-02 DIAGNOSIS — I10 Essential (primary) hypertension: Secondary | ICD-10-CM | POA: Diagnosis not present

## 2018-03-02 DIAGNOSIS — M5416 Radiculopathy, lumbar region: Secondary | ICD-10-CM | POA: Diagnosis not present

## 2018-03-02 DIAGNOSIS — N958 Other specified menopausal and perimenopausal disorders: Secondary | ICD-10-CM | POA: Diagnosis not present

## 2018-03-02 DIAGNOSIS — K219 Gastro-esophageal reflux disease without esophagitis: Secondary | ICD-10-CM | POA: Diagnosis not present

## 2018-03-02 DIAGNOSIS — R7301 Impaired fasting glucose: Secondary | ICD-10-CM | POA: Diagnosis not present

## 2018-03-02 DIAGNOSIS — E7849 Other hyperlipidemia: Secondary | ICD-10-CM | POA: Diagnosis not present

## 2018-03-02 DIAGNOSIS — Z0000 Encounter for general adult medical examination without abnormal findings: Secondary | ICD-10-CM | POA: Diagnosis not present

## 2018-03-03 ENCOUNTER — Other Ambulatory Visit (INDEPENDENT_AMBULATORY_CARE_PROVIDER_SITE_OTHER): Payer: Self-pay

## 2018-03-03 ENCOUNTER — Encounter (INDEPENDENT_AMBULATORY_CARE_PROVIDER_SITE_OTHER): Payer: Self-pay | Admitting: Orthopaedic Surgery

## 2018-03-03 ENCOUNTER — Ambulatory Visit (INDEPENDENT_AMBULATORY_CARE_PROVIDER_SITE_OTHER): Payer: Medicare HMO | Admitting: Orthopaedic Surgery

## 2018-03-03 ENCOUNTER — Other Ambulatory Visit: Payer: Self-pay | Admitting: Gastroenterology

## 2018-03-03 DIAGNOSIS — M5441 Lumbago with sciatica, right side: Secondary | ICD-10-CM | POA: Diagnosis not present

## 2018-03-03 DIAGNOSIS — Z1212 Encounter for screening for malignant neoplasm of rectum: Secondary | ICD-10-CM | POA: Diagnosis not present

## 2018-03-03 DIAGNOSIS — G8929 Other chronic pain: Secondary | ICD-10-CM

## 2018-03-03 DIAGNOSIS — M4807 Spinal stenosis, lumbosacral region: Secondary | ICD-10-CM

## 2018-03-03 DIAGNOSIS — M7541 Impingement syndrome of right shoulder: Secondary | ICD-10-CM

## 2018-03-03 MED ORDER — DICLOFENAC SODIUM 1 % TD GEL: 2.0000 g | Freq: Four times a day (QID) | TRANSDERMAL | 3 refills | Status: DC

## 2018-03-03 NOTE — Progress Notes (Signed)
The patient is well-known to [place].  She continues to be plagued by low back pain with right-sided radicular symptoms and sciatica.  She has had intervention by Dr. Nedra Hai again.  Her last MRI was in 2016 but thinks he.  X-rays that I rereviewed of her lumbar spine from August 2018 showed severe degenerative disc disease at L4-L5 and L5-S1 with posterior element arthritic changes as well.  On exam she has a positive straight leg raise to the right side but no severe weakness in her legs.  She has significant pain on flexion-extension of her lumbar spine.  Her right shoulder still hurting her and most of her pain seems to be at the [facility] joint but otherwise has a normal shoulder function and motion and strength.  At this point we definitely need an MRI of the lumbar spine to compare to the previous one from 2016 given the worsening of her symptoms and sciatica so we can help guide with the next step of therapy will be.  She is already petite individual.  I will send in some Voltaren gel for her.  She is Artie been to physical therapy and work on core strengthening exercises well.  We will see her back after the MRI of her lumbar spine.

## 2018-03-04 DIAGNOSIS — R69 Illness, unspecified: Secondary | ICD-10-CM | POA: Diagnosis not present

## 2018-03-15 ENCOUNTER — Ambulatory Visit
Admission: RE | Admit: 2018-03-15 | Discharge: 2018-03-15 | Disposition: A | Discharge status: Home / Self Care | Payer: Medicare HMO | Source: Ambulatory Visit | Attending: Orthopaedic Surgery | Admitting: Orthopaedic Surgery

## 2018-03-15 ENCOUNTER — Other Ambulatory Visit: Payer: Medicare HMO

## 2018-03-15 DIAGNOSIS — M4807 Spinal stenosis, lumbosacral region: Secondary | ICD-10-CM

## 2018-03-15 DIAGNOSIS — M48061 Spinal stenosis, lumbar region without neurogenic claudication: Secondary | ICD-10-CM | POA: Diagnosis not present

## 2018-03-17 ENCOUNTER — Encounter (INDEPENDENT_AMBULATORY_CARE_PROVIDER_SITE_OTHER): Payer: Self-pay | Admitting: Orthopaedic Surgery

## 2018-03-17 ENCOUNTER — Ambulatory Visit (INDEPENDENT_AMBULATORY_CARE_PROVIDER_SITE_OTHER): Payer: Medicare HMO | Admitting: Orthopaedic Surgery

## 2018-03-17 DIAGNOSIS — M4807 Spinal stenosis, lumbosacral region: Secondary | ICD-10-CM | POA: Diagnosis not present

## 2018-03-17 NOTE — Progress Notes (Signed)
Office Visit Note   Patient: Donna Lynn           Date of Birth: 23-Jul-1941           MRN: [id number] Visit Date: 03/17/2018              Requested by: Martha Clan, MD [address] [place], [place] [address] PCP: Martha Clan, MD   Assessment & Plan: Visit Diagnoses:  1. Spinal stenosis of lumbosacral region     Plan: Send her back to Dr. Cyndie Chime for bilateral L4 transforaminal epidural steroid injections and possible facet injections L5-S1.  She will follow-up in 1 month after these injections to see what type of response she had to this.  She will continue doing home exercise program.  Follow-Up Instructions: Return in about 1 month (around 04/14/2018).   Orders:  No orders of the defined types were placed in this encounter.  No orders of the defined types were placed in this encounter.     Procedures: No procedures performed   Clinical Data: No additional findings.   Subjective: Chief Complaint  Patient presents with  . Lower Back - Follow-up    HPI Ms. Donna Lynn returns today to go over the MRI of her lumbar spine.  She continues to have pain in her low back that radiates down both legs.  This occurs with sitting and standing.  He does awaken her in the morning.  She had no bowel bladder dysfunction.  She continues to do her home exercise program.  She describes the pain as a toothache pain with burning-like sensation but no numbness tingling is down the lateral aspect of both legs to the knees.  MRI images are reviewed with the patient today and show mild spinal stenosis.  She is had progression of  disc and facet degeneration at L4-5 with moderate spinal stenosis and foraminal stenosis bilaterally.  L5-S1 4 mm anterior listhesis.  Mild foraminal stenosis bilaterally at L4-5.  Moderate facet degeneration at L4-5.   Review of Systems Please see HPI otherwise negative  Objective: Vital Signs: There were no vitals taken for this visit.  Physical Exam    Constitutional: She is oriented to person, place, and time. She appears well-developed and well-nourished. No distress.  Pulmonary/Chest: Effort normal.  Neurological: She is alert and oriented to person, place, and time.  Skin: She is not diaphoretic.  Psychiatric: She has a normal mood and affect.    Ortho Exam Negative straight leg raise bilaterally.  Tight hamstrings bilaterally. Specialty Comments:  No specialty comments available.  Imaging: No results found.   PMFS History: Patient Active Problem List   Diagnosis Date Noted  . Allergic rhinoconjunctivitis 08/20/2017  . Diverticulosis of colon without hemorrhage 12/02/2016  . GERD (gastroesophageal reflux disease) 12/02/2016  . Metatarsalgia of left foot 07/07/2015  . Metatarsal deformity 07/07/2015  . Injury of left foot 07/07/2015   Past Medical History:  Diagnosis Date  . Allergy   . Arthritis   . Cataract    left eye surgery  . Chronic back pain   . Chronic leg pain   . Diverticulosis   . Esophagitis   . GERD (gastroesophageal reflux disease)   . Hyperlipidemia   . Hypertension     Family History  Problem Relation Age of Onset  . Diverticulosis Mother   . Breast cancer Mother   . Irritable bowel syndrome Mother   . Thyroid disease Mother   . Arthritis Mother   .  Dementia Mother   . Thyroid cancer Brother   . Lung cancer Brother   . Colon cancer Neg Hx   . Esophageal cancer Neg Hx   . Stomach cancer Neg Hx   . Allergic rhinitis Neg Hx   . Angioedema Neg Hx   . Asthma Neg Hx   . Eczema Neg Hx   . Immunodeficiency Neg Hx   . Urticaria Neg Hx     Past Surgical History:  Procedure Laterality Date  . ABDOMINAL HYSTERECTOMY     partial  . BREAST RECONSTRUCTION Right    and cyst removal-right  . CATARACT EXTRACTION Left   . CHOLECYSTECTOMY    . DILATION AND CURETTAGE OF UTERUS    . TONSILLECTOMY     Social History   Occupational History  . Occupation: retired    Associate Professor: [employer]   Tobacco Use  . Smoking status: Former Smoker    Years: 3.00  . Smokeless tobacco: Never Used  Substance and Sexual Activity  . Alcohol use: No    Alcohol/week: 0.0 oz  . Drug use: No  . Sexual activity: Not on file

## 2018-04-01 ENCOUNTER — Other Ambulatory Visit: Payer: Self-pay | Admitting: Gastroenterology

## 2018-04-06 DIAGNOSIS — R69 Illness, unspecified: Secondary | ICD-10-CM | POA: Diagnosis not present

## 2018-04-15 ENCOUNTER — Telehealth (INDEPENDENT_AMBULATORY_CARE_PROVIDER_SITE_OTHER): Payer: Self-pay | Admitting: Orthopaedic Surgery

## 2018-04-15 ENCOUNTER — Other Ambulatory Visit (INDEPENDENT_AMBULATORY_CARE_PROVIDER_SITE_OTHER): Payer: Self-pay

## 2018-04-15 DIAGNOSIS — M4807 Spinal stenosis, lumbosacral region: Secondary | ICD-10-CM

## 2018-04-15 NOTE — Telephone Encounter (Signed)
I don't know how this was missed, can you call her with appointment  I'm sorry

## 2018-04-15 NOTE — Telephone Encounter (Signed)
Called pt and explained the process of scheduling since pt has [employer] insurance.

## 2018-04-15 NOTE — Telephone Encounter (Signed)
Patient called asked if she is going to be referred to Dr Alvester MorinNewton for an injection in her back. The number to contact patient is [phone]

## 2018-04-20 ENCOUNTER — Ambulatory Visit (INDEPENDENT_AMBULATORY_CARE_PROVIDER_SITE_OTHER): Payer: Medicare HMO | Admitting: Orthopaedic Surgery

## 2018-05-19 ENCOUNTER — Encounter (INDEPENDENT_AMBULATORY_CARE_PROVIDER_SITE_OTHER): Payer: Self-pay | Admitting: Physical Medicine and Rehabilitation

## 2018-05-19 ENCOUNTER — Ambulatory Visit (INDEPENDENT_AMBULATORY_CARE_PROVIDER_SITE_OTHER): Payer: Medicare HMO | Admitting: Physical Medicine and Rehabilitation

## 2018-05-19 ENCOUNTER — Ambulatory Visit (INDEPENDENT_AMBULATORY_CARE_PROVIDER_SITE_OTHER): Payer: Self-pay

## 2018-05-19 VITALS — BP 113/70 | HR 80

## 2018-05-19 DIAGNOSIS — G8929 Other chronic pain: Secondary | ICD-10-CM

## 2018-05-19 DIAGNOSIS — M5441 Lumbago with sciatica, right side: Secondary | ICD-10-CM

## 2018-05-19 DIAGNOSIS — G894 Chronic pain syndrome: Secondary | ICD-10-CM

## 2018-05-19 DIAGNOSIS — M5416 Radiculopathy, lumbar region: Secondary | ICD-10-CM | POA: Diagnosis not present

## 2018-05-19 DIAGNOSIS — M48062 Spinal stenosis, lumbar region with neurogenic claudication: Secondary | ICD-10-CM

## 2018-05-19 MED ORDER — BETAMETHASONE SOD PHOS & ACET 6 (3-3) MG/ML IJ SUSP
12.0000 mg | Freq: Once | INTRAMUSCULAR | Status: AC
  Administered 2018-05-19: 12 mg

## 2018-05-19 NOTE — Progress Notes (Signed)
 .  Numeric Pain Rating Scale and Functional Assessment Average Pain 7   In the last MONTH (on 0-10 scale) has pain interfered with the following?  1. General activity like being  able to carry out your everyday physical activities such as walking, climbing stairs, carrying groceries, or moving a chair?  Rating(4)   +Driver, -BT, -Dye Allergies.  

## 2018-05-19 NOTE — Patient Instructions (Signed)

## 2018-05-28 NOTE — Progress Notes (Signed)
Donna Lynn - 77 y.o. female MRN [id number]  Date of birth: June 10, 1941  Office Visit Note: Visit Date: 05/19/2018 PCP: Martha Clan, MD Referred by: Martha Clan, MD  Subjective: Chief Complaint  Patient presents with  . Lower Back - Pain  . Right Leg - Pain   HPI: Donna Lynn is a 77 year old female fairly well-known to me over the years we have seen her on a few occasions.  She is followed by Dr. Doneen Poisson in the office from an orthopedic standpoint.  He actually has seen her since I have and is updated MRI of the lumbar spine which is reviewed with the patient today and reviewed below.  This shows increasing spondylitic arthropathy at L4-5 with moderate stenosis multifactorial.  No significant nerve compression.  There is also increasing listhesis of L5 on S1 really without much in the way of foraminal narrowing although it is present.  Prior injection in February did give her temporary relief of right hip and leg pain which is more of an L5 distribution.  Given the new findings and worsening we are going to try today a right L4 transforaminal injection.  She is having worsening back and right hip and leg pain that has been ongoing for many years.  She gets worsening with lifting up the leg and trying to stand and particularly in the mornings.  She does use Naprosyn for some relief.   ROS Otherwise per HPI.  Assessment & Plan: Visit Diagnoses:  1. Lumbar radiculopathy   2. Spinal stenosis of lumbar region with neurogenic claudication   3. Chronic bilateral low back pain with right-sided sciatica   4. Chronic pain syndrome     Plan: No additional findings.   Meds & Orders:  Meds ordered this encounter  Medications  . betamethasone acetate-betamethasone sodium phosphate (CELESTONE) injection 12 mg    Orders Placed This Encounter  Procedures  . XR C-ARM NO REPORT  . Epidural Steroid injection    Follow-up: Return if symptoms worsen or fail to improve.    Procedures: No procedures performed  Lumbosacral Transforaminal Epidural Steroid Injection - Sub-Pedicular Approach with Fluoroscopic Guidance  Patient: Donna Lynn      Date of Birth: [birth year] MRN: [id number] PCP: Martha Clan, MD      Visit Date: 05/19/2018   Universal Protocol:    Date/Time: 05/19/2018  Consent Given By: the patient  Position: PRONE  Additional Comments: Vital signs were monitored before and after the procedure. Patient was prepped and draped in the usual sterile fashion. The correct patient, procedure, and site was verified.   Injection Procedure Details:  Procedure Site One Meds Administered:  Meds ordered this encounter  Medications  . betamethasone acetate-betamethasone sodium phosphate (CELESTONE) injection 12 mg    Laterality: Right  Location/Site:  L4-L5  Needle size: 20 G  Needle type: Spinal  Needle Placement: Transforaminal  Findings:    -Comments: Excellent flow of contrast along the nerve and into the epidural space.  Procedure Details: After squaring off the end-plates to get a true AP view, the C-arm was positioned so that an oblique view of the foramen as noted above was visualized. The target area is just inferior to the "nose of the scotty dog" or sub pedicular. The soft tissues overlying this structure were infiltrated with 2-3 ml. of 1% Lidocaine without Epinephrine.  The spinal needle was inserted toward the target using a "trajectory" view along the fluoroscope beam.  Under AP and lateral visualization, the needle  was advanced so it did not puncture dura and was located close the 6 O'Clock position of the pedical in AP tracterory. Biplanar projections were used to confirm position. Aspiration was confirmed to be negative for CSF and/or blood. A 1-2 ml. volume of Isovue-250 was injected and flow of contrast was noted at each level. Radiographs were obtained for documentation purposes.   After attaining the desired  flow of contrast documented above, a 0.5 to 1.0 ml test dose of 0.25% Marcaine was injected into each respective transforaminal space.  The patient was observed for 90 seconds post injection.  After no sensory deficits were reported, and normal lower extremity motor function was noted,   the above injectate was administered so that equal amounts of the injectate were placed at each foramen (level) into the transforaminal epidural space.   Additional Comments:  The patient tolerated the procedure well Dressing: Band-Aid    Post-procedure details: Patient was observed during the procedure. Post-procedure instructions were reviewed.  Patient left the clinic in stable condition.    Clinical History: MRI LUMBAR SPINE WITHOUT CONTRAST  TECHNIQUE: Multiplanar, multisequence MR imaging of the lumbar spine was performed. No intravenous contrast was administered.  COMPARISON:  Lumbar MRI 02/11/2015  FINDINGS: Segmentation:  Normal  Alignment: Mild retrolisthesis L2-3, L3-4. Mild anterolisthesis L5-S1  Vertebrae:  Negative for fracture or mass.  Conus medullaris and cauda equina: Conus extends to the L1-2 level. Conus and cauda equina appear normal.  Paraspinal and other soft tissues: Negative  Disc levels:  L1-2: Negative  L2-3: Negative  L3-4: Mild disc bulging and mild facet degeneration. Mild spinal stenosis  L4-5: Advanced facet degeneration. Diffuse disc bulging with endplate edema right greater than left. Diffuse endplate spurring. Moderate spinal stenosis and moderate subarticular stenosis bilaterally. Mild progression in stenosis since the prior MRI.  L5-S1: 4 mm anterolisthesis with progression from the prior study. Moderate facet degeneration similar to the prior study. Mild subarticular stenosis bilaterally  IMPRESSION: Mild spinal stenosis L3-4  Progressive disc and facet degeneration L4-5 with progressive anterolisthesis. Moderate spinal  stenosis and subarticular stenosis bilaterally has progressed in the interval  4 mm anterolisthesis L5-S1 with mild subarticular stenosis bilaterally.   Electronically Signed   By: [name]  Clark M.D.   On: 03/15/2018 13:43   She reports that she has quit smoking. She quit after 3.00 years of use. She has never used smokeless tobacco. No results for input(s): HGBA1C, LABURIC in the last 8760 hours.  Objective:  VS:  HT:    WT:   BMI:     BP:113/70  HR:80bpm  TEMP: ( )  RESP:  Physical Exam  Ortho Exam Imaging: No results found.  Past Medical/Family/Surgical/Social History: Medications & Allergies reviewed per EMR, new medications updated. Patient Active Problem List   Diagnosis Date Noted  . Allergic rhinoconjunctivitis 08/20/2017  . Diverticulosis of colon without hemorrhage 12/02/2016  . GERD (gastroesophageal reflux disease) 12/02/2016  . Metatarsalgia of left foot 07/07/2015  . Metatarsal deformity 07/07/2015  . Injury of left foot 07/07/2015   Past Medical History:  Diagnosis Date  . Allergy   . Arthritis   . Cataract    left eye surgery  . Chronic back pain   . Chronic leg pain   . Diverticulosis   . Esophagitis   . GERD (gastroesophageal reflux disease)   . Hyperlipidemia   . Hypertension    Family History  Problem Relation Age of Onset  . Diverticulosis Mother   . Breast cancer Mother   .  Irritable bowel syndrome Mother   . Thyroid disease Mother   . Arthritis Mother   . Dementia Mother   . Thyroid cancer Brother   . Lung cancer Brother   . Colon cancer Neg Hx   . Esophageal cancer Neg Hx   . Stomach cancer Neg Hx   . Allergic rhinitis Neg Hx   . Angioedema Neg Hx   . Asthma Neg Hx   . Eczema Neg Hx   . Immunodeficiency Neg Hx   . Urticaria Neg Hx    Past Surgical History:  Procedure Laterality Date  . ABDOMINAL HYSTERECTOMY     partial  . BREAST RECONSTRUCTION Right    and cyst removal-right  . CATARACT EXTRACTION Left   .  CHOLECYSTECTOMY    . DILATION AND CURETTAGE OF UTERUS    . TONSILLECTOMY     Social History   Occupational History  . Occupation: retired    Associate Professor: [employer]  Tobacco Use  . Smoking status: Former Smoker    Years: 3.00  . Smokeless tobacco: Never Used  Substance and Sexual Activity  . Alcohol use: No    Alcohol/week: 0.0 oz  . Drug use: No  . Sexual activity: Not on file

## 2018-05-28 NOTE — Procedures (Signed)
Lumbosacral Transforaminal Epidural Steroid Injection - Sub-Pedicular Approach with Fluoroscopic Guidance  Patient: Donna BattyRebecca S Earp      Date of Birth: Dec 19, 1940 MRN: [id number] PCP: Martha ClanShaw, William, MD      Visit Date: 05/19/2018   Universal Protocol:    Date/Time: 05/19/2018  Consent Given By: the patient  Position: PRONE  Additional Comments: Vital signs were monitored before and after the procedure. Patient was prepped and draped in the usual sterile fashion. The correct patient, procedure, and site was verified.   Injection Procedure Details:  Procedure Site One Meds Administered:  Meds ordered this encounter  Medications  . betamethasone acetate-betamethasone sodium phosphate (CELESTONE) injection 12 mg    Laterality: Right  Location/Site:  L4-L5  Needle size: 20 G  Needle type: Spinal  Needle Placement: Transforaminal  Findings:    -Comments: Excellent flow of contrast along the nerve and into the epidural space.  Procedure Details: After squaring off the end-plates to get a true AP view, the C-arm was positioned so that an oblique view of the foramen as noted above was visualized. The target area is just inferior to the "nose of the scotty dog" or sub pedicular. The soft tissues overlying this structure were infiltrated with 2-3 ml. of 1% Lidocaine without Epinephrine.  The spinal needle was inserted toward the target using a "trajectory" view along the fluoroscope beam.  Under AP and lateral visualization, the needle was advanced so it did not puncture dura and was located close the 6 O'Clock position of the pedical in AP tracterory. Biplanar projections were used to confirm position. Aspiration was confirmed to be negative for CSF and/or blood. A 1-2 ml. volume of Isovue-250 was injected and flow of contrast was noted at each level. Radiographs were obtained for documentation purposes.   After attaining the desired flow of contrast documented above, a 0.5 to  1.0 ml test dose of 0.25% Marcaine was injected into each respective transforaminal space.  The patient was observed for 90 seconds post injection.  After no sensory deficits were reported, and normal lower extremity motor function was noted,   the above injectate was administered so that equal amounts of the injectate were placed at each foramen (level) into the transforaminal epidural space.   Additional Comments:  The patient tolerated the procedure well Dressing: Band-Aid    Post-procedure details: Patient was observed during the procedure. Post-procedure instructions were reviewed.  Patient left the clinic in stable condition.

## 2018-06-09 ENCOUNTER — Emergency Department (HOSPITAL_COMMUNITY)
Admission: EM | Admit: 2018-06-09 | Discharge: 2018-06-09 | Disposition: A | Discharge status: Home / Self Care | Payer: Medicare HMO | Attending: Emergency Medicine | Admitting: Emergency Medicine

## 2018-06-09 ENCOUNTER — Emergency Department (HOSPITAL_COMMUNITY): Payer: Medicare HMO

## 2018-06-09 ENCOUNTER — Encounter (HOSPITAL_COMMUNITY): Payer: Self-pay

## 2018-06-09 DIAGNOSIS — R270 Ataxia, unspecified: Secondary | ICD-10-CM | POA: Diagnosis not present

## 2018-06-09 DIAGNOSIS — Z8673 Personal history of transient ischemic attack (TIA), and cerebral infarction without residual deficits: Secondary | ICD-10-CM | POA: Insufficient documentation

## 2018-06-09 DIAGNOSIS — M25519 Pain in unspecified shoulder: Secondary | ICD-10-CM | POA: Diagnosis not present

## 2018-06-09 DIAGNOSIS — W19XXXA Unspecified fall, initial encounter: Secondary | ICD-10-CM | POA: Diagnosis not present

## 2018-06-09 DIAGNOSIS — Z87891 Personal history of nicotine dependence: Secondary | ICD-10-CM | POA: Diagnosis not present

## 2018-06-09 DIAGNOSIS — Z79899 Other long term (current) drug therapy: Secondary | ICD-10-CM | POA: Insufficient documentation

## 2018-06-09 DIAGNOSIS — R42 Dizziness and giddiness: Secondary | ICD-10-CM | POA: Diagnosis not present

## 2018-06-09 DIAGNOSIS — I10 Essential (primary) hypertension: Secondary | ICD-10-CM | POA: Insufficient documentation

## 2018-06-09 DIAGNOSIS — R110 Nausea: Secondary | ICD-10-CM | POA: Diagnosis not present

## 2018-06-09 LAB — CBC
HEMATOCRIT: 40.2 % (ref 36.0–46.0)
HEMOGLOBIN: 13.6 g/dL (ref 12.0–15.0)
MCH: 32 pg (ref 26.0–34.0)
MCHC: 33.8 g/dL (ref 30.0–36.0)
MCV: 94.6 fL (ref 78.0–100.0)
Platelets: 168 10*3/uL (ref 150–400)
RBC: 4.25 MIL/uL (ref 3.87–5.11)
RDW: 11.9 % (ref 11.5–15.5)
WBC: 6.2 10*3/uL (ref 4.0–10.5)

## 2018-06-09 LAB — BASIC METABOLIC PANEL
Anion gap: 9 (ref 5–15)
BUN: 15 mg/dL (ref 8–23)
CHLORIDE: 107 mmol/L (ref 98–111)
CO2: 25 mmol/L (ref 22–32)
Calcium: 8.9 mg/dL (ref 8.9–10.3)
Creatinine, Ser: 0.87 mg/dL (ref 0.44–1.00)
GFR calc Af Amer: >60 mL/min (ref >60)
GFR calc non Af Amer: >60 mL/min (ref >60)
GLUCOSE: 109 mg/dL — AB (ref 70–99)
Potassium: 3.7 mmol/L (ref 3.5–5.1)
Sodium: 141 mmol/L (ref 135–145)

## 2018-06-09 LAB — URINALYSIS, ROUTINE W REFLEX MICROSCOPIC
Bilirubin Urine: NEGATIVE
Glucose, UA: NEGATIVE mg/dL
Hgb urine dipstick: NEGATIVE
Ketones, ur: NEGATIVE mg/dL
LEUKOCYTES UA: NEGATIVE
NITRITE: NEGATIVE
PH: 7 (ref 5.0–8.0)
Protein, ur: NEGATIVE mg/dL
SPECIFIC GRAVITY, URINE: 1.003 — AB (ref 1.005–1.030)

## 2018-06-09 LAB — CBG MONITORING, ED: GLUCOSE-CAPILLARY: 87 mg/dL (ref 70–99)

## 2018-06-09 MED ORDER — MECLIZINE HCL 25 MG PO TABS
50.0000 mg | ORAL_TABLET | Freq: Once | ORAL | Status: AC
  Administered 2018-06-09: 50 mg via ORAL
  Filled 2018-06-09: qty 2

## 2018-06-09 MED ORDER — MECLIZINE HCL 12.5 MG PO TABS: 12.5000 mg | ORAL_TABLET | Freq: Three times a day (TID) | ORAL | 0 refills | Status: DC | PRN

## 2018-06-09 MED ORDER — LORAZEPAM 2 MG/ML IJ SOLN
1.0000 mg | Freq: Once | INTRAMUSCULAR | Status: AC
  Administered 2018-06-09: 1 mg via INTRAVENOUS
  Filled 2018-06-09: qty 1

## 2018-06-09 NOTE — ED Triage Notes (Signed)
Pt in from home via [facility], per report pt woke up at 5 am and was at baseline, pt went back to sleep and woke up at 9am with dizziness and nausea, denies neuro deficits, nausea relieved with crackers, pt c/o L shoulder pain d/t fall on Sunday, denies LOC at that, A&O x4,  No facial droop,  No slurred speech

## 2018-06-09 NOTE — ED Notes (Addendum)
Assisted pt to bathroom, pt very unsteady on feet, took several stumbling steps, RN had to catch pt.  She stated she felt "drunk... Just very tired".  Informed provider.

## 2018-06-09 NOTE — Discharge Instructions (Signed)
Follow-up closely with her primary doctor and neurology. Return to the emergency room for chest pain, worsening symptoms, if you pass out or new concerns. Stay well-hydrated and take dizziness medication as needed.

## 2018-06-09 NOTE — ED Notes (Signed)
ED Provider at bedside. 

## 2018-06-09 NOTE — ED Notes (Signed)
Patient transported to MRI 

## 2018-06-09 NOTE — ED Provider Notes (Signed)
Patient's care signed out to follow-up MRI of the brain.  Patient having intermittent vertigo sensation since this morning.  No chest pain or syncope.  Patient's MRI results with no acute findings reviewed with patient.  Patient feeling tired and mild dizziness on reassessment worsened by Ativan.  Discussed outpatient follow-up.   Kenton KingfisherJoshua M Keishla Oyer    Irianna Gilday, MD 06/09/18 [phone]

## 2018-06-11 DIAGNOSIS — Z6821 Body mass index (BMI) 21.0-21.9, adult: Secondary | ICD-10-CM | POA: Diagnosis not present

## 2018-06-11 DIAGNOSIS — H8110 Benign paroxysmal vertigo, unspecified ear: Secondary | ICD-10-CM | POA: Diagnosis not present

## 2018-06-18 ENCOUNTER — Other Ambulatory Visit: Payer: Self-pay | Admitting: Gastroenterology

## 2018-06-18 NOTE — ED Provider Notes (Signed)
[facility] EMERGENCY DEPARTMENT Provider Note   CSN: [id number] Arrival date & time: 06/09/18  1048     History   Chief Complaint Chief Complaint  Patient presents with  . Dizziness    HPI Donna Lynn is a 77 y.o. female.  HPI  77 year old female comes in with chief complaint of dizziness. Patient has history of hypertension and hyperlipidemia.  She denies any history of stroke.  Patient reports that she is been having constant dizziness, described as spinning sensation.  Her vertigo is worse with movement, but it is persistent at baseline.  Patient reports that she woke up at 5 AM and felt a little dizzy, she went back to sleep, woke up at 9 AM and had dizziness with nausea at that time.  Patient has had couple of episodes of emesis.  She denies any chest pain, review of system is positive for left-sided shoulder pain.  Patient also denies any neck pain, vision changes, focal numbness or weakness.  Past Medical History:  Diagnosis Date  . Allergy   . Arthritis   . Cataract    left eye surgery  . Chronic back pain   . Chronic leg pain   . Diverticulosis   . Esophagitis   . GERD (gastroesophageal reflux disease)   . Hyperlipidemia   . Hypertension     Patient Active Problem List   Diagnosis Date Noted  . Allergic rhinoconjunctivitis 08/20/2017  . Diverticulosis of colon without hemorrhage 12/02/2016  . GERD (gastroesophageal reflux disease) 12/02/2016  . Metatarsalgia of left foot 07/07/2015  . Metatarsal deformity 07/07/2015  . Injury of left foot 07/07/2015    Past Surgical History:  Procedure Laterality Date  . ABDOMINAL HYSTERECTOMY     partial  . BREAST RECONSTRUCTION Right    and cyst removal-right  . CATARACT EXTRACTION Left   . CHOLECYSTECTOMY    . DILATION AND CURETTAGE OF UTERUS    . TONSILLECTOMY       OB History   None      Home Medications    Prior to Admission medications   Medication Sig Start Date End Date  Taking? Authorizing Provider  Azelastine HCl 0.15 % SOLN Place 2 sprays into both nostrils 2 (two) times daily. Patient taking differently: Place 2 sprays into both nostrils 2 (two) times daily as needed (allergies).  12/30/16  Yes Padgett, Pilar Grammes, MD  docusate sodium (COLACE) 100 MG capsule Take 100 mg by mouth daily.   Yes [provider]  estradiol (ESTRACE VAGINAL) 0.1 MG/GM vaginal cream Estrace 0.01% (0.1 mg/gram) vaginal cream  Insert 1 g twice a week by vaginal route at bedtime.   Yes [provider]  estradiol (ESTRACE) 1 MG tablet Take 1 mg by mouth daily.    Yes [provider]  hydrocortisone (ANUSOL-HC) 25 MG suppository Use on 2 times a week for rectal pain as needed Patient taking differently: Place 25 mg rectally See admin instructions. Use on 2 times a week for rectal pain as needed 01/15/17  Yes Danis, Starr Lake III, MD  irbesartan (AVAPRO) 75 MG tablet Take 75 mg by mouth daily.    Yes [provider]  loratadine (CLARITIN) 10 MG tablet Take 10 mg by mouth daily.   Yes [provider]  Multiple Vitamin (MULTIVITAMIN WITH MINERALS) TABS tablet Take 1 tablet by mouth daily.   Yes [provider]  diclofenac sodium (VOLTAREN) 1 % GEL Apply 2 g topically 4 (four) times daily.  Patient not taking: Reported on 06/09/2018 03/03/18   Kathryne Hitch, MD  dicyclomine (BENTYL) 10 MG capsule TAKE ONE CAPSULE BY MOUTH TWICE A DAY BEFORE MEALS 06/18/18   Sherrilyn Rist, MD  meclizine (ANTIVERT) 12.5 MG tablet Take 1 tablet (12.5 mg total) by mouth 3 (three) times daily as needed for dizziness. 06/09/18   Blane Ohara, MD  methylPREDNISolone (MEDROL) 4 MG tablet Take as directed Patient not taking: Reported on 06/09/2018 01/20/18   Kirtland Bouchard, PA-C  Olopatadine HCl (PAZEO) 0.7 % SOLN Place 1 drop into both eyes daily as needed. As needed for itchy, red or watery eyes. Patient not taking: Reported on 06/09/2018 12/30/16    Marcelyn Bruins, MD  pantoprazole (PROTONIX) 40 MG tablet TAKE 1 TABLET BY MOUTH EVERY DAY Patient not taking: Reported on 06/09/2018 01/21/18   Sherrilyn Rist, MD    Family History Family History  Problem Relation Age of Onset  . Diverticulosis Mother   . Breast cancer Mother   . Irritable bowel syndrome Mother   . Thyroid disease Mother   . Arthritis Mother   . Dementia Mother   . Thyroid cancer Brother   . Lung cancer Brother   . Colon cancer Neg Hx   . Esophageal cancer Neg Hx   . Stomach cancer Neg Hx   . Allergic rhinitis Neg Hx   . Angioedema Neg Hx   . Asthma Neg Hx   . Eczema Neg Hx   . Immunodeficiency Neg Hx   . Urticaria Neg Hx     Social History Social History   Tobacco Use  . Smoking status: Former Smoker    Years: 3.00  . Smokeless tobacco: Never Used  Substance Use Topics  . Alcohol use: No    Alcohol/week: 0.0 standard drinks  . Drug use: No     Allergies   Patient has no known allergies.   Review of Systems Review of Systems  Gastrointestinal: Positive for nausea.  Neurological: Positive for dizziness.  All other systems reviewed and are negative.    Physical Exam Updated Vital Signs BP (!) 145/74   Pulse 77   Temp 97.6 F (36.4 C) (Oral)   Resp 16   SpO2 99%   Physical Exam  Constitutional: She is oriented to person, place, and time. She appears well-developed.  HENT:  Head: Normocephalic and atraumatic.  Eyes: EOM are normal.  Neck: Normal range of motion. Neck supple.  Cardiovascular: Normal rate.  Pulmonary/Chest: Effort normal.  Abdominal: Bowel sounds are normal.  Neurological: She is alert and oriented to person, place, and time. No cranial nerve deficit. Coordination normal.  Cerebellar exam is normal (finger to nose) Sensory exam normal for bilateral upper and lower extremities - and patient is able to discriminate between sharp and dull. Motor exam is 4+/5   Skin: Skin is warm and dry.  Nursing note  and vitals reviewed.    ED Treatments / Results  Labs (all labs ordered are listed, but only abnormal results are displayed) Labs Reviewed  BASIC METABOLIC PANEL - Abnormal; Notable for the following components:      Result Value   Glucose, Bld 109 (*)    All other components within normal limits  URINALYSIS, ROUTINE W REFLEX MICROSCOPIC - Abnormal; Notable for the following components:   Color, Urine STRAW (*)    Specific Gravity, Urine 1.003 (*)    All other components within normal limits  CBC  CBG MONITORING, ED  EKG EKG Interpretation  Date/Time:  Tuesday June 09 2018 10:54:16 EDT Ventricular Rate:  72 PR Interval:  152 QRS Duration: 124 QT Interval:  400 QTC Calculation: 438 R Axis:   24 Text Interpretation:  Normal sinus rhythm Right bundle branch block Abnormal ECG No acute changes No old tracing to compare Confirmed by Derwood KaplanNanavati, Kourtlyn Charlet [id number]) on 06/09/2018 3:07:48 PM   Radiology No results found.  Procedures Procedures (including critical care time)  Medications Ordered in ED Medications  LORazepam (ATIVAN) injection 1 mg (1 mg Intravenous Given 06/09/18 1741)  meclizine (ANTIVERT) tablet 50 mg (50 mg Oral Given 06/09/18 1618)     Initial Impression / Assessment and Plan / ED Course  I have reviewed the triage vital signs and the nursing notes.  Pertinent labs & imaging results that were available during my care of the patient were reviewed by me and considered in my medical decision making (see chart for details).     77 year old female with history of hypertension and hyperlipidemia comes in with chief complaint of vertigo.  Her vertigo is constant, with intermittent episodes of flareups which present with movement. Patient has hypertension and hyperlipidemia. Neuro exam is nonfocal, hints exam is not suggestive of a specific diagnosis.  DDx includes: Central vertigo:  Tumor  Stroke  ICH  Vertebrobasilar TIA  Peripheral  Vertigo:  BPPV  Vestibular neuritis  Meniere disease  Migrainous vertigo  Ear Infection   Patient was given Ativan without significant relief. MRI has been ordered to rule out stroke.  Final Clinical Impressions(s) / ED Diagnoses   Final diagnoses:  Vertigo    ED Discharge Orders         Ordered    meclizine (ANTIVERT) 12.5 MG tablet  3 times daily PRN     06/09/18 2032           Derwood KaplanNanavati, Symantha Steeber, MD 06/18/18 2308

## 2018-06-23 ENCOUNTER — Other Ambulatory Visit: Payer: Self-pay | Admitting: Gastroenterology

## 2018-06-23 NOTE — Telephone Encounter (Signed)
Bentyl 10 mg BID. Refill request for 90 days supply. Last seen 01-2018.

## 2018-06-23 NOTE — Telephone Encounter (Signed)
A 90 day script for 180 capsules with one refill is fine.  I sent it electronically, but it did not work for unclear reasons. Please send it to pharmacy somehow.  - HD

## 2018-06-24 ENCOUNTER — Other Ambulatory Visit: Payer: Self-pay

## 2018-06-24 MED ORDER — DICYCLOMINE HCL 10 MG PO CAPS: ORAL_CAPSULE | ORAL | 1 refills | Status: AC

## 2018-06-24 NOTE — Telephone Encounter (Signed)
Resent Rx as directed

## 2018-07-01 DIAGNOSIS — Z01419 Encounter for gynecological examination (general) (routine) without abnormal findings: Secondary | ICD-10-CM | POA: Diagnosis not present

## 2018-07-02 ENCOUNTER — Ambulatory Visit: Payer: Medicare HMO | Admitting: Neurology

## 2018-07-07 DIAGNOSIS — R69 Illness, unspecified: Secondary | ICD-10-CM | POA: Diagnosis not present

## 2018-07-15 DIAGNOSIS — L237 Allergic contact dermatitis due to plants, except food: Secondary | ICD-10-CM | POA: Diagnosis not present

## 2018-08-10 DIAGNOSIS — R300 Dysuria: Secondary | ICD-10-CM | POA: Diagnosis not present

## 2018-09-02 ENCOUNTER — Telehealth: Payer: Self-pay | Admitting: Gastroenterology

## 2018-09-02 NOTE — Telephone Encounter (Signed)
Pt has used the hydrocortisone suppositories as needed for rectal pain over the last few months on a as needed basis. She denies any bowel habit problems and no rectal bleeding. She is requesting a written/printed Rx for refills. Please advise. Last seen 01-2018

## 2018-09-02 NOTE — Telephone Encounter (Signed)
Left a message eto return call.

## 2018-09-08 MED ORDER — HYDROCORTISONE ACETATE 25 MG RE SUPP: 25.0000 mg | RECTAL | 3 refills | Status: DC

## 2018-09-08 NOTE — Telephone Encounter (Signed)
Patient states her insurance does not cover medication and would like a script sent to her in the mail so she can take it somewhere cheaper.

## 2018-09-08 NOTE — Telephone Encounter (Signed)
Done

## 2018-09-09 MED ORDER — HYDROCORTISONE ACETATE 25 MG RE SUPP: 25.0000 mg | RECTAL | 3 refills | Status: DC

## 2018-09-09 NOTE — Telephone Encounter (Signed)
Mailed to pts home address on file tt

## 2018-09-22 DIAGNOSIS — R69 Illness, unspecified: Secondary | ICD-10-CM | POA: Diagnosis not present

## 2019-10-17 IMAGING — MR MR HEAD W/O CM
10 of 11 series · 43 of 48 positions shown · non-contrast
Comparison: Prior MRI from 01/04/2012.

CLINICAL DATA: Initial evaluation for acute ataxia, dizziness.
Evaluate for stroke.

EXAM:
MRI HEAD WITHOUT CONTRAST
TECHNIQUE: Multiplanar, multiecho pulse sequences of the brain and surrounding
structures were obtained without intravenous contrast.

[Series 5: ax dwi_tracew · axial · 3.0mm · 1.50mm/px · z∈[-96,+35]mm · 10 of 92 slices shown]
[im 1/92]
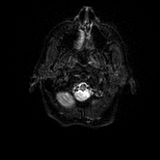
[im 11/92]
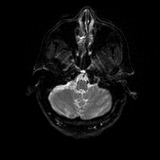
[im 21/92]
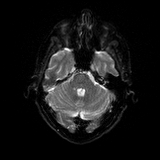
[im 31/92]
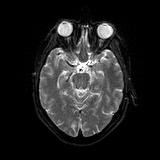
[im 41/92]
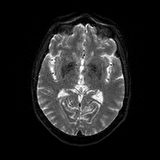
[im 51/92]
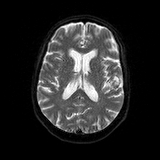
[im 61/92]
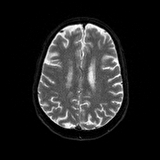
[im 71/92]
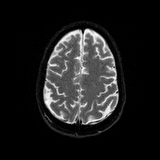
[im 81/92]
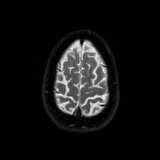
[im 92/92]
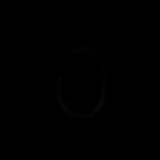

[Series 6: ax dwi_adc · axial · 3.0mm · 1.50mm/px · z∈[-96,+35]mm · 5 of 46 slices shown]
[im 1/46]
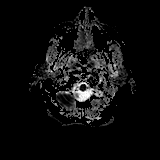
[im 12/46]
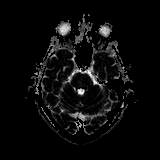
[im 23/46]
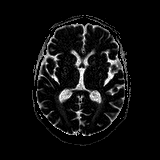
[im 34/46]
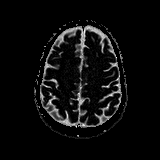
[im 46/46]
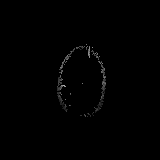

[Series 7: cor dwi_tracew · coronal · 5.0mm · 1.44mm/px · 7 of 70 slices shown]
[im 1/70]
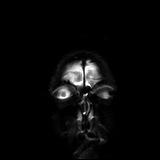
[im 12/70]
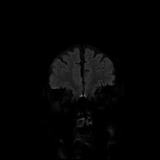
[im 24/70]
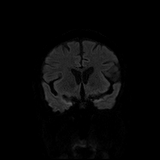
[im 35/70]
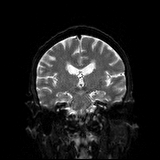
[im 47/70]
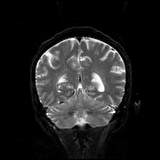
[im 58/70]
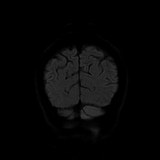
[im 70/70]
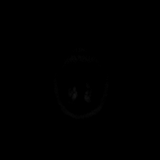

[Series 8: cor dwi_adc · coronal · 5.0mm · 1.44mm/px · 3 of 35 slices shown]
[im 1/35]
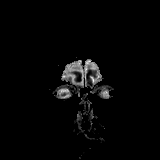
[im 18/35]
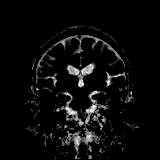
[im 35/35]
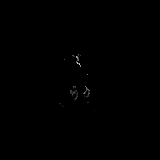

[Series 9: T1 · sagittal · 5.0mm · 0.75mm/px · 2 of 23 slices shown]
[im 1/23]
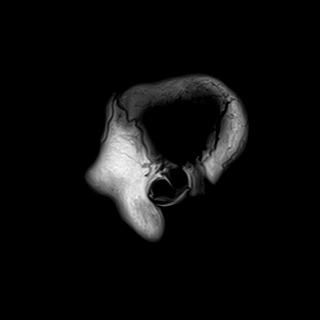
[im 23/23]
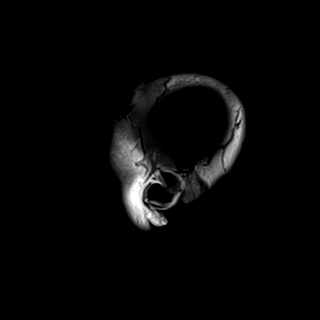

[Series 10: T2 · axial · 5.0mm · 0.72mm/px · z∈[-107,+34]mm · 2 of 25 slices shown (1 of 2)]
[im 1/25]
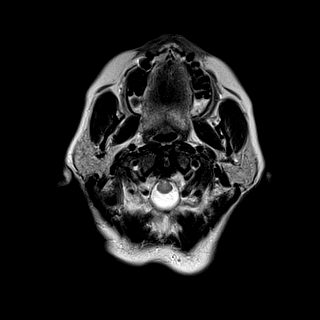
[im 25/25]
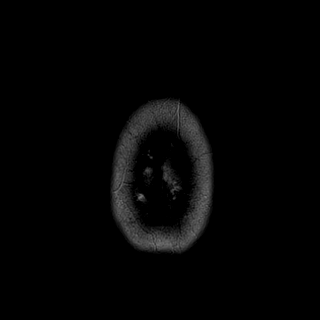

[Series 11: FLAIR · axial · 5.0mm · 0.45mm/px · z∈[-108,+32]mm · 2 of 25 slices shown]
[im 1/25]
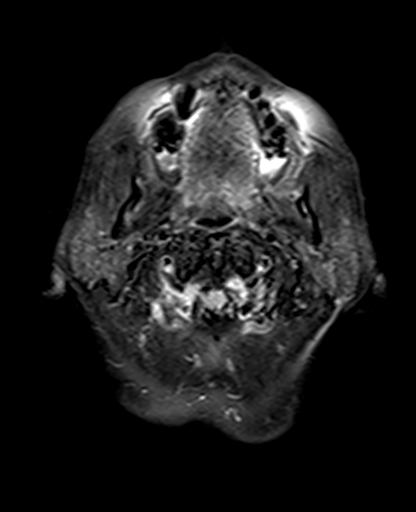
[im 25/25]
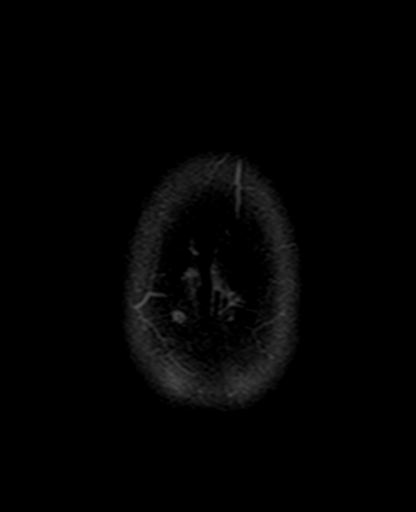

[Series 12: swi_images · axial · 3.0mm · 0.90mm/px · z∈[-114,+35]mm · 5 of 52 slices shown]
[im 1/52]
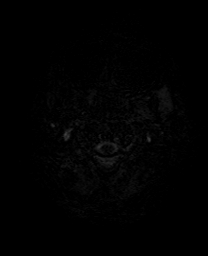
[im 13/52]
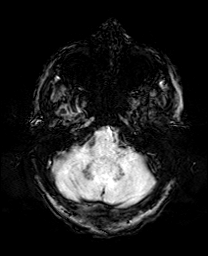
[im 26/52]
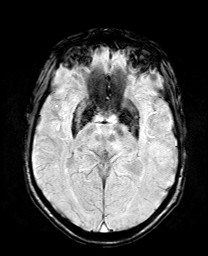
[im 39/52]
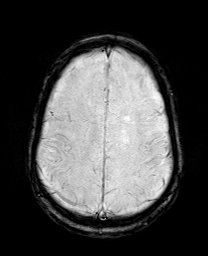
[im 52/52]
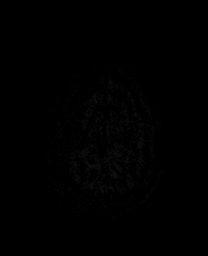

[Series 13: mip_images(sw) · axial · 24.0mm · 0.90mm/px · z∈[-104,+25]mm · 4 of 45 slices shown]
[im 1/45]
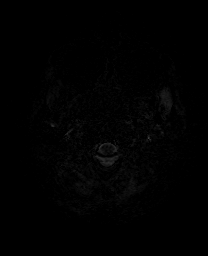
[im 15/45]
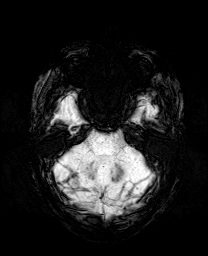
[im 30/45]
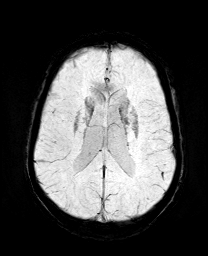
[im 45/45]
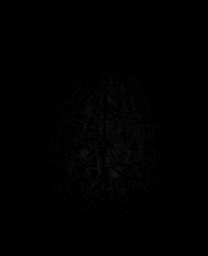

[Series 15: T2 · coronal · 5.0mm · 0.34mm/px · 3 of 29 slices shown (2 of 2)]
[im 1/29]
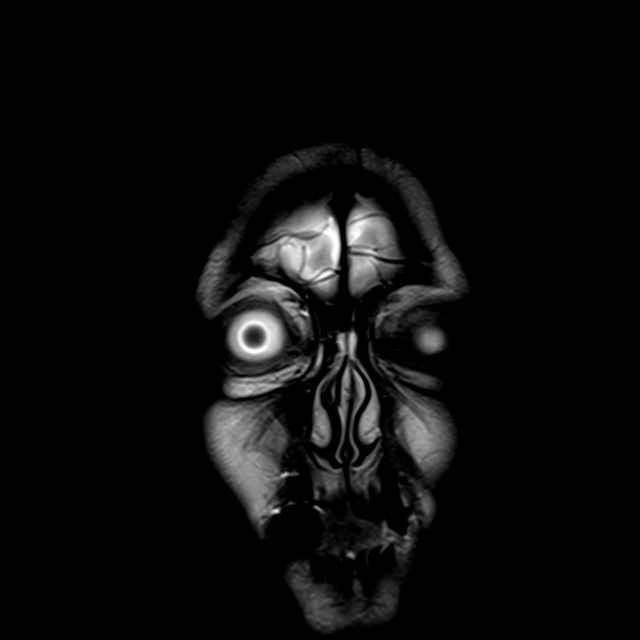
[im 15/29]
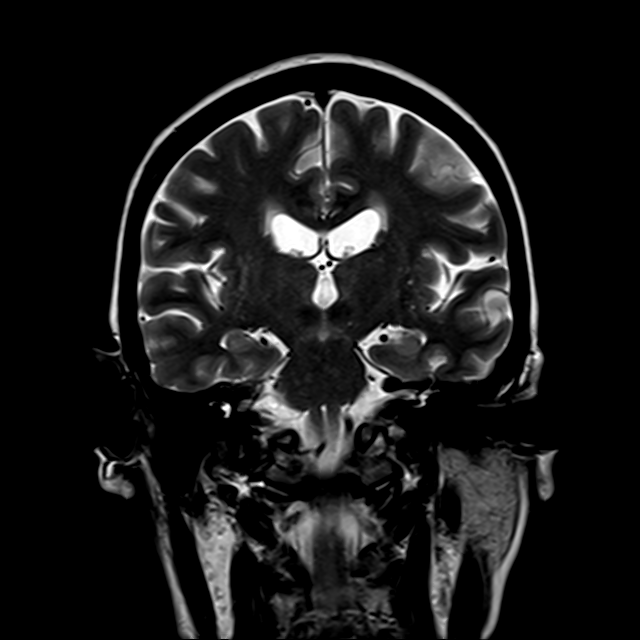
[im 29/29]
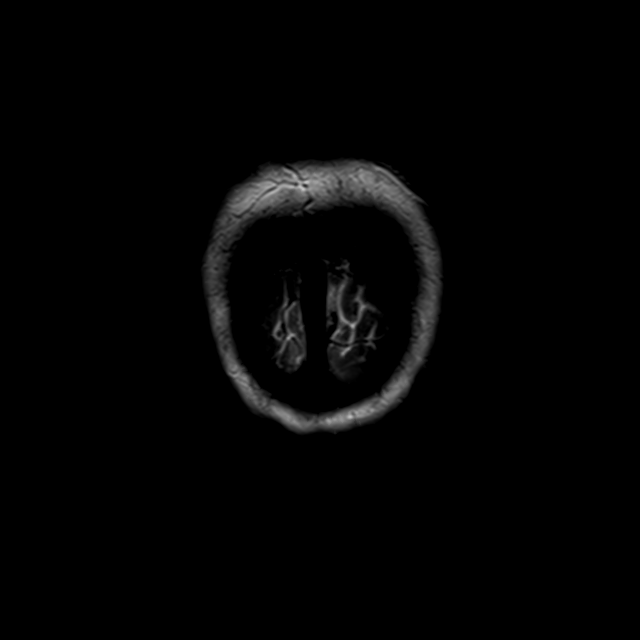

[43 of 48 positions shown; findings below may reference images not displayed]

FINDINGS: Brain: Cerebral volume within normal limits for patient age. Mild
chronic small vessel ischemic disease. No abnormal foci of
restricted diffusion to suggest acute or subacute ischemia.
Gray-white matter differentiation well maintained. No
encephalomalacia to suggest chronic infarction. No foci of
susceptibility artifact to suggest acute or chronic intracranial
hemorrhage.

Mass lesion, midline shift or mass effect. No hydrocephalus. No
extra-axial fluid collection. Major dural sinuses are grossly
patent.

Pituitary gland and suprasellar region are normal. Midline
structures intact and normal.

Vascular: Major intracranial vascular flow voids well maintained and
normal in appearance.

Skull and upper cervical spine: Craniocervical junction normal.
Visualized upper cervical spine within normal limits. Bone marrow
signal intensity somewhat diffusely decreased on T1 weighted
imaging, most commonly related to anemia, smoking, or obesity. No
scalp soft tissue abnormality.

Sinuses/Orbits: Globes and orbital soft tissues within normal
limits. Paranasal sinuses are clear. No mastoid effusion. Inner ear
structures normal.

Other: None.
IMPRESSION: 1. No acute intracranial abnormality.
2. Mild chronic small vessel ischemic disease.

## 2019-11-12 DIAGNOSIS — H16142 Punctate keratitis, left eye: Secondary | ICD-10-CM | POA: Diagnosis not present

## 2019-11-16 DIAGNOSIS — H16142 Punctate keratitis, left eye: Secondary | ICD-10-CM | POA: Diagnosis not present

## 2019-11-26 ENCOUNTER — Telehealth: Payer: Self-pay | Admitting: Gastroenterology

## 2019-11-26 NOTE — Telephone Encounter (Signed)
Pt states that her issues with reflux came back last year. She stated that Dr. Myrtie Neither does not want her to take dicyclomine so she wants to know what else she can take. She has been taking prevacid but it does not help much. Pls call her.

## 2019-11-26 NOTE — Telephone Encounter (Signed)
Patient reports c/o return of reflux symptoms and "achey esophagus." Patient last OV 01/07/2018, patient scheduled for OV on 12/01/19 at 3 pm.

## 2019-12-01 ENCOUNTER — Ambulatory Visit: Payer: Commercial Managed Care - HMO | Admitting: Gastroenterology

## 2019-12-01 ENCOUNTER — Encounter: Payer: Self-pay | Admitting: Gastroenterology

## 2019-12-01 VITALS — BP 120/80 | HR 80 | Temp 98.1°F | Ht 61.0 in | Wt 118.8 lb

## 2019-12-01 DIAGNOSIS — R140 Abdominal distension (gaseous): Secondary | ICD-10-CM

## 2019-12-01 DIAGNOSIS — K219 Gastro-esophageal reflux disease without esophagitis: Secondary | ICD-10-CM

## 2019-12-01 MED ORDER — FDGARD 25-20.75 MG PO CAPS: 25.0000 mg | ORAL_CAPSULE | Freq: Two times a day (BID) | ORAL | 0 refills | Status: AC | PRN

## 2019-12-01 MED ORDER — SUCRALFATE 1 G PO TABS: 1.0000 g | ORAL_TABLET | Freq: Three times a day (TID) | ORAL | 2 refills | Status: AC

## 2019-12-01 NOTE — Patient Instructions (Addendum)
If you are age 77 or older, your body mass index should be between 23-30. Your Body mass index is 22.45 kg/m. If this is out of the aforementioned range listed, please consider follow up with your Primary Care Provider.  If you are age 21 or younger, your body mass index should be between 19-25. Your Body mass index is 22.45 kg/m. If this is out of the aformentioned range listed, please consider follow up with your Primary Care Provider.   Medication Samples have been provided to the patient.  Drug name: FDGARD        Qty: 5 boxes LOT: [id number] Exp.Date: 02-2020  Dosing instructions: 2 capsules a day  It was a pleasure to see you today!  Dr. Myrtie Neither

## 2019-12-01 NOTE — Progress Notes (Signed)
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  Barium study was normal at that time. Shortly after that, in February 2018, she was seen for proctalgia, which she reported improved with Anusol suppositories.  Colonoscopy at that time showed diverticulosis and grade 1 internal hemorrhoids. Last seen by me March 2019, at which point she had worsening of her regurgitation pyrosis bloating belching and dysphagia after stopping PPI (she seemed to recall that I wanted her to stop it).  Gastric emptying study and EGD normal.  Mabeline has been having some recurrent symptoms she wanted to discuss.  She has not regularly been on PPI, because did not feel she always needed it, and had some concerns about taking it long-term.  It is somewhat difficult to follow the history, but it seems like she has intermittent upper abdominal bloating, regurgitation that might be accompanied by pyrosis, and also sometimes a feeling of "ache" in the esophagus.  This seems to be an uncomfortable feeling that might occur sometimes after meals not always with heartburn.  She denies dysphagia.  Has been taking papaya enzyme.  She no longer has proctalgia, but had some dicyclomine leftover from when that problem was occurring.  She took it for her esophageal symptoms a few months ago but did not find it to be effective.  ROS: Cardiovascular:  no chest pain Respiratory: no dyspnea  The patient's Past Medical, Family and Social History were reviewed and are on file in the EMR.  Objective:  Med list reviewed  Current Outpatient Medications:  .  ascorbic acid (VITAMIN C) 1000 MG tablet, Take 1 tablet by mouth daily., Disp: , Rfl:  .  docusate sodium (COLACE) 100 MG capsule, Take 100 mg by mouth daily., Disp: , Rfl:  .  estradiol (ESTRACE) 1 MG tablet, Take 1 mg by mouth daily. , Disp: ,  Rfl:  .  lansoprazole (PREVACID) 15 MG capsule, Take 15 mg by mouth daily at 12 noon., Disp: , Rfl:  .  loratadine (CLARITIN) 10 MG tablet, Take 10 mg by mouth daily., Disp: , Rfl:  .  Multiple Vitamin (MULTIVITAMIN WITH MINERALS) TABS tablet, Take 1 tablet by mouth daily., Disp: , Rfl:  .  naproxen (NAPROSYN) 500 MG tablet, Take 1 tablet by mouth 2 (two) times daily., Disp: , Rfl:  .  dicyclomine (BENTYL) 10 MG capsule, 1 BID with meals (Patient not taking: Reported on 12/01/2019), Disp: 180 capsule, Rfl: 1  Current Facility-Administered Medications:  .  0.9 %  sodium chloride infusion, 500 mL, Intravenous, Continuous, Danis, Makira Holleman L III, MD .  0.9 %  sodium chloride infusion, 500 mL, Intravenous, Once, Danis, Starr Lake III, MD   Vital signs in last 24 hrs: Vitals:   12/01/19 1511  BP: 120/80  Pulse: 80  Temp: 98.1 F (36.7 C)    Physical Exam  Well-appearing, normal vocal quality, no muscle wasting  HEENT: sclera anicteric, oral mucosa moist without lesions  Neck: supple, no thyromegaly, JVD or lymphadenopathy  Cardiac: RRR without murmurs, S1S2 heard, no peripheral edema  Pulm: clear to auscultation bilaterally, normal RR and effort noted  Abdomen: soft, no tenderness, with active bowel sounds. No guarding or palpable hepatosplenomegaly.  Skin; warm and dry, no jaundice or rash  Labs:   ___________________________________________ Radiologic studies:   ____________________________________________ Other:   _____________________________________________ @ASSESSMENTPLANBEGIN @ Assessment: Encounter Diagnoses  Name Primary?   Gastroesophageal reflux disease without esophagitis Yes  . Abdominal bloating    Chronic GERD symptoms, also suspect nonulcer dyspepsia and some esophageal irritability triggered by reflux or certain foods. Overall sounds benign, no endoscopy or other testing planned at this point.  Plan: It is not clear if she needs regular PPI, and I think  she can continue to take her OTC Prevacid as needed when she has pyrosis. We discussed the utility but also limitations of acid suppression, the need for diet and lifestyle measures.  I think she would benefit more from treatment that would help coat the esophagus such as Carafate.  Prescription was given, also samples of FD gard to try 2 capsules a day.  Will see her as needed.   30 minutes were spent on this encounter (including chart review, history/exam, counseling/coordination of care, and documentation)  Nelida Meuse III

## 2019-12-31 DIAGNOSIS — H5201 Hypermetropia, right eye: Secondary | ICD-10-CM | POA: Diagnosis not present

## 2020-02-01 DIAGNOSIS — H40053 Ocular hypertension, bilateral: Secondary | ICD-10-CM | POA: Diagnosis not present

## 2020-03-08 DIAGNOSIS — R7301 Impaired fasting glucose: Secondary | ICD-10-CM | POA: Diagnosis not present

## 2020-03-08 DIAGNOSIS — Z0000 Encounter for general adult medical examination without abnormal findings: Secondary | ICD-10-CM | POA: Diagnosis not present

## 2020-03-08 DIAGNOSIS — E7849 Other hyperlipidemia: Secondary | ICD-10-CM | POA: Diagnosis not present

## 2020-03-15 DIAGNOSIS — Z0000 Encounter for general adult medical examination without abnormal findings: Secondary | ICD-10-CM | POA: Diagnosis not present

## 2020-03-15 DIAGNOSIS — E785 Hyperlipidemia, unspecified: Secondary | ICD-10-CM | POA: Diagnosis not present

## 2020-03-15 DIAGNOSIS — F3342 Major depressive disorder, recurrent, in full remission: Secondary | ICD-10-CM | POA: Diagnosis not present

## 2020-03-15 DIAGNOSIS — Z1331 Encounter for screening for depression: Secondary | ICD-10-CM | POA: Diagnosis not present

## 2020-03-15 DIAGNOSIS — R7301 Impaired fasting glucose: Secondary | ICD-10-CM | POA: Diagnosis not present

## 2020-03-15 DIAGNOSIS — K219 Gastro-esophageal reflux disease without esophagitis: Secondary | ICD-10-CM | POA: Diagnosis not present

## 2020-03-15 DIAGNOSIS — M5416 Radiculopathy, lumbar region: Secondary | ICD-10-CM | POA: Diagnosis not present

## 2020-03-15 DIAGNOSIS — I10 Essential (primary) hypertension: Secondary | ICD-10-CM | POA: Diagnosis not present

## 2020-03-15 DIAGNOSIS — N958 Other specified menopausal and perimenopausal disorders: Secondary | ICD-10-CM | POA: Diagnosis not present

## 2020-03-22 DIAGNOSIS — Z1212 Encounter for screening for malignant neoplasm of rectum: Secondary | ICD-10-CM | POA: Diagnosis not present
